# Patient Record
Sex: Female | Born: 1975
Health system: Southern US, Community
[De-identification: ages and names within clinical notes are randomized; demographics above are authoritative.]

## PROBLEM LIST (undated history)

## (undated) DIAGNOSIS — I10 Essential (primary) hypertension: Secondary | ICD-10-CM

## (undated) DIAGNOSIS — R7303 Prediabetes: Secondary | ICD-10-CM

## (undated) DIAGNOSIS — N3281 Overactive bladder: Secondary | ICD-10-CM

## (undated) DIAGNOSIS — Z973 Presence of spectacles and contact lenses: Secondary | ICD-10-CM

## (undated) HISTORY — PX: LIVER RESECTION: SHX1977

## (undated) HISTORY — PX: APPENDECTOMY: SHX54

## (undated) HISTORY — DX: Essential (primary) hypertension: I10

## (undated) HISTORY — PX: DILATION AND CURETTAGE OF UTERUS: SHX78

## (undated) HISTORY — DX: Overactive bladder: N32.81

---
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
## 2001-07-05 ENCOUNTER — Encounter: Admission: RE | Admit: 2001-07-05 | Discharge: 2001-07-05 | Payer: Self-pay | Admitting: Internal Medicine

## 2001-07-05 ENCOUNTER — Encounter: Payer: Self-pay | Admitting: Internal Medicine

## 2002-07-08 ENCOUNTER — Other Ambulatory Visit: Admission: RE | Admit: 2002-07-08 | Discharge: 2002-07-08 | Payer: Self-pay | Admitting: Obstetrics and Gynecology

## 2002-08-22 ENCOUNTER — Encounter: Payer: Self-pay | Admitting: Obstetrics and Gynecology

## 2002-08-22 ENCOUNTER — Ambulatory Visit (HOSPITAL_COMMUNITY): Admission: RE | Admit: 2002-08-22 | Discharge: 2002-08-22 | Payer: Self-pay | Admitting: Obstetrics and Gynecology

## 2002-12-25 ENCOUNTER — Encounter (HOSPITAL_COMMUNITY): Admission: RE | Admit: 2002-12-25 | Discharge: 2003-01-08 | Payer: Self-pay | Admitting: Obstetrics and Gynecology

## 2002-12-26 ENCOUNTER — Inpatient Hospital Stay (HOSPITAL_COMMUNITY): Admission: AD | Admit: 2002-12-26 | Discharge: 2002-12-26 | Payer: Self-pay | Admitting: Obstetrics and Gynecology

## 2003-01-12 ENCOUNTER — Inpatient Hospital Stay (HOSPITAL_COMMUNITY): Admission: AD | Admit: 2003-01-12 | Discharge: 2003-01-12 | Payer: Self-pay | Admitting: Obstetrics and Gynecology

## 2003-01-13 ENCOUNTER — Inpatient Hospital Stay (HOSPITAL_COMMUNITY): Admission: AD | Admit: 2003-01-13 | Discharge: 2003-01-15 | Payer: Self-pay | Admitting: Obstetrics and Gynecology

## 2003-08-11 ENCOUNTER — Other Ambulatory Visit: Admission: RE | Admit: 2003-08-11 | Discharge: 2003-08-11 | Payer: Self-pay | Admitting: Obstetrics and Gynecology

## 2003-12-08 ENCOUNTER — Encounter: Admission: RE | Admit: 2003-12-08 | Discharge: 2003-12-08 | Payer: Self-pay | Admitting: Internal Medicine

## 2004-08-17 ENCOUNTER — Other Ambulatory Visit: Admission: RE | Admit: 2004-08-17 | Discharge: 2004-08-17 | Payer: Self-pay | Admitting: Obstetrics and Gynecology

## 2005-09-19 ENCOUNTER — Other Ambulatory Visit: Admission: RE | Admit: 2005-09-19 | Discharge: 2005-09-19 | Payer: Self-pay | Admitting: Obstetrics and Gynecology

## 2006-10-02 ENCOUNTER — Ambulatory Visit (HOSPITAL_COMMUNITY): Admission: RE | Admit: 2006-10-02 | Discharge: 2006-10-02 | Payer: Self-pay | Admitting: Obstetrics and Gynecology

## 2006-11-20 ENCOUNTER — Ambulatory Visit (HOSPITAL_BASED_OUTPATIENT_CLINIC_OR_DEPARTMENT_OTHER): Admission: RE | Admit: 2006-11-20 | Discharge: 2006-11-20 | Payer: Self-pay | Admitting: Urology

## 2011-04-14 ENCOUNTER — Other Ambulatory Visit: Payer: Self-pay | Admitting: "Endocrinology

## 2011-04-15 LAB — CLIENT PROFILE 3332
Free T4: 1.27 ng/dL (ref 0.80–1.80)
T3, Free: 2.9 pg/mL (ref 2.3–4.2)
TSH: 1.416 u[IU]/mL (ref 0.350–4.500)

## 2011-04-15 LAB — THYROID PEROXIDASE ANTIBODY: Thyroperoxidase Ab SerPl-aCnc: 17.4 IU/mL (ref <35.0)

## 2012-06-05 ENCOUNTER — Ambulatory Visit: Payer: BC Managed Care – PPO | Admitting: Family Medicine

## 2012-06-05 VITALS — BP 124/80 | HR 93 | Temp 99.6°F | Resp 18 | Ht 63.0 in | Wt 186.0 lb

## 2012-06-05 DIAGNOSIS — J029 Acute pharyngitis, unspecified: Secondary | ICD-10-CM

## 2012-06-05 DIAGNOSIS — J020 Streptococcal pharyngitis: Secondary | ICD-10-CM

## 2012-06-05 LAB — POCT CBC
HCT, POC: 42.5 % (ref 37.7–47.9)
Hemoglobin: 13.2 g/dL (ref 12.2–16.2)
Lymph, poc: 1.2 (ref 0.6–3.4)
MCH, POC: 28.3 pg (ref 27–31.2)
MCHC: 31.1 g/dL — AB (ref 31.8–35.4)
MCV: 91.1 fL (ref 80–97)
POC MID %: 3.9 %M (ref 0–12)
RBC: 4.66 M/uL (ref 4.04–5.48)
WBC: 22.3 10*3/uL — AB (ref 4.6–10.2)

## 2012-06-05 LAB — POCT RAPID STREP A (OFFICE): Rapid Strep A Screen: POSITIVE — AB

## 2012-06-05 MED ORDER — IBUPROFEN 600 MG PO TABS: 600.0000 mg | ORAL_TABLET | Freq: Three times a day (TID) | ORAL | Status: AC | PRN

## 2012-06-05 MED ORDER — MAGIC MOUTHWASH W/LIDOCAINE: 10.0000 mL | ORAL | Status: DC | PRN

## 2012-06-05 MED ORDER — AZITHROMYCIN 250 MG PO TABS: ORAL_TABLET | ORAL | Status: AC

## 2012-06-05 MED ORDER — IBUPROFEN 200 MG PO TABS
800.0000 mg | ORAL_TABLET | Freq: Once | ORAL | Status: AC
  Administered 2012-06-05: 800 mg via ORAL

## 2012-06-05 NOTE — Patient Instructions (Addendum)
1. Sore throat  POCT rapid strep A, POCT CBC, POCT glucose (manual entry), ibuprofen (ADVIL,MOTRIN) tablet 800 mg  2. Strep pharyngitis  azithromycin (ZITHROMAX) 250 MG tablet, Alum & Mag Hydroxide-Simeth (MAGIC MOUTHWASH W/LIDOCAINE) SOLN   Strep Throat Strep throat is an infection of the throat caused by a bacteria named Streptococcus pyogenes. Your caregiver may call the infection streptococcal "tonsillitis" or "pharyngitis" depending on whether there are signs of inflammation in the tonsils or back of the throat. Strep throat is most common in children from 63 to 61 years old during the cold months of the year, but it can occur in people of any age during any season. This infection is spread from person to person (contagious) through coughing, sneezing, or other close contact. SYMPTOMS   Fever or chills.   Painful, swollen, red tonsils or throat.   Pain or difficulty when swallowing.   White or yellow spots on the tonsils or throat.   Swollen, tender lymph nodes or "glands" of the neck or under the jaw.   Red rash all over the body (rare).  DIAGNOSIS  Many different infections can cause the same symptoms. A test must be done to confirm the diagnosis so the right treatment can be given. A "rapid strep test" can help your caregiver make the diagnosis in a few minutes. If this test is not available, a light swab of the infected area can be used for a throat culture test. If a throat culture test is done, results are usually available in a day or two. TREATMENT  Strep throat is treated with antibiotic medicine. HOME CARE INSTRUCTIONS   Gargle with 1 tsp of salt in 1 cup of warm water, 3 to 4 times per day or as needed for comfort.   Family members who also have a sore throat or fever should be tested for strep throat and treated with antibiotics if they have the strep infection.   Make sure everyone in your household washes their hands well.   Do not share food, drinking cups, or  personal items that could cause the infection to spread to others.   You may need to eat a soft food diet until your sore throat gets better.   Drink enough water and fluids to keep your urine clear or pale yellow. This will help prevent dehydration.   Get plenty of rest.   Stay home from school, daycare, or work until you have been on antibiotics for 24 hours.   Only take over-the-counter or prescription medicines for pain, discomfort, or fever as directed by your caregiver.   If antibiotics are prescribed, take them as directed. Finish them even if you start to feel better.  SEEK MEDICAL CARE IF:   The glands in your neck continue to enlarge.   You develop a rash, cough, or earache.   You cough up green, yellow-brown, or bloody sputum.   You have pain or discomfort not controlled by medicines.   Your problems seem to be getting worse rather than better.  SEEK IMMEDIATE MEDICAL CARE IF:   You develop any new symptoms such as vomiting, severe headache, stiff or painful neck, chest pain, shortness of breath, or trouble swallowing.   You develop severe throat pain, drooling, or changes in your voice.   You develop swelling of the neck, or the skin on the neck becomes red and tender.   You have a fever.   You develop signs of dehydration, such as fatigue, dry mouth, and decreased urination.  You become increasingly sleepy, or you cannot wake up completely.  Document Released: 09/16/2000 Document Revised: 09/08/2011 Document Reviewed: 11/18/2010 [facility] Patient Information 2012 [place], [place].

## 2012-06-05 NOTE — Progress Notes (Signed)
Subjective:    Patient ID: Shannon Salas, female    DOB: 22-Dec-1975, 36 y.o.   MRN: [id number]  HPIThis 36 y.o. female presents for evaluation of sore throat, fever.  +fever Tmax 101.1.  +HA; no ear pain; +ST Left only; +pain with swallowing; onset yesterday.  +spitting.  +pain with talking; +pain with opening mouth.  No rhinorrhea; no nasal congestion; no cough.  Mild nausea no vomiting.  No diarrhea.  No rash.  No tick bites but does have a bite on RLE new yesterday.  Boyfriend with cold last week; no other sick exposures.    PCP: [facility] PMH: HTN, overactive bladder. Psurg: Appendectomy, C-section, D&C, cyst cervical resection. All: PCN (rash) Medications: OCP, Amlodipine, Metoprolol, Detrol Social: +tobacco sporadic over past two months.   Review of Systems  Constitutional: Positive for fever, chills, diaphoresis and fatigue.  HENT: Positive for sore throat. Negative for ear pain, congestion, rhinorrhea, mouth sores, neck stiffness and postnasal drip.   Respiratory: Negative for cough and shortness of breath.   Gastrointestinal: Positive for nausea. Negative for vomiting, abdominal pain and diarrhea.  Skin: Positive for wound. Negative for rash.  Neurological: Positive for headaches.    Past Medical History  Diagnosis Date  . Hypertension   . Overactive bladder     Past Surgical History  Procedure Date  . Appendectomy   . Cesarean section   . Dilation and curettage of uterus     Prior to Admission medications   Medication Sig Start Date End Date Taking? Authorizing Provider  amLODipine (NORVASC) 10 MG tablet Take 10 mg by mouth daily.   Yes Historical Provider, MD  metoprolol (LOPRESSOR) 50 MG tablet Take 25 mg by mouth 2 (two) times daily.   Yes Historical Provider, MD  tolterodine (DETROL LA) 4 MG 24 hr capsule Take 4 mg by mouth daily.   Yes Historical Provider, MD    Allergies  Allergen Reactions  . Penicillins Rash    History   Social  History  . Marital Status: Married    Spouse Name: N/A    Number of Children: N/A  . Years of Education: N/A   Occupational History  . Not on file.   Social History Main Topics  . Smoking status: Current Some Day Smoker  . Smokeless tobacco: Not on file  . Alcohol Use: 1.2 oz/week    2 Glasses of wine per week  . Drug Use: Not on file  . Sexually Active: Not on file   Other Topics Concern  . Not on file   Social History Narrative  . No narrative on file    No family history on file.     Objective:   Physical Exam  Nursing note and vitals reviewed. Constitutional: She is oriented to person, place, and time. She appears well-developed and well-nourished. She appears distressed.  HENT:  Head: Normocephalic and atraumatic.  Right Ear: External ear normal.  Left Ear: External ear normal.  Mouth/Throat: Oropharyngeal exudate present.  Eyes: Conjunctivae and EOM are normal. Pupils are equal, round, and reactive to light.  Neck: Normal range of motion. Neck supple.  Cardiovascular: Normal rate, regular rhythm, normal heart sounds and intact distal pulses.   No murmur heard. Pulmonary/Chest: Effort normal and breath sounds normal. No stridor.  Lymphadenopathy:    She has cervical adenopathy.  Neurological: She is alert and oriented to person, place, and time. No cranial nerve deficit.  Skin: Skin is warm and dry. No rash noted. She is  not diaphoretic.       R LATERAL LOWER LEG:  HEALING PUSTULE WITH 1 CM AREA SURROUNDING ERYTHEMA; NO INDURATION, FLUCTUANTS.  Psychiatric: She has a normal mood and affect. Her behavior is normal. Judgment and thought content normal.     Results for orders placed in visit on 06/05/12  POCT RAPID STREP A (OFFICE)      Component Value Range   Rapid Strep A Screen Positive (*) Negative  POCT CBC      Component Value Range   WBC 22.3 (*) 4.6 - 10.2 K/uL   Lymph, poc 1.2  0.6 - 3.4   POC LYMPH PERCENT 5.2 (*) 10 - 50 %L   MID (cbc) 0.9  0 -  0.9   POC MID % 3.9  0 - 12 %M   POC Granulocyte 20.3 (*) 2 - 6.9   Granulocyte percent 90.9 (*) 37 - 80 %G   RBC 4.66  4.04 - 5.48 M/uL   Hemoglobin 13.2  12.2 - 16.2 g/dL   HCT, POC 16.1  09.6 - 47.9 %   MCV 91.1  80 - 97 fL   MCH, POC 28.3  27 - 31.2 pg   MCHC 31.1 (*) 31.8 - 35.4 g/dL   RDW, POC 04.5     Platelet Count, POC 389  142 - 424 K/uL   MPV 8.3  0 - 99.8 fL  GLUCOSE, POCT (MANUAL RESULT ENTRY)      Component Value Range   POC Glucose 107 (*) 70 - 99 mg/dl        Assessment & Plan:   1. Sore throat  POCT rapid strep A, POCT CBC, POCT glucose (manual entry), ibuprofen (ADVIL,MOTRIN) tablet 800 mg  2. Strep pharyngitis  azithromycin (ZITHROMAX) 250 MG tablet, Alum & Mag Hydroxide-Simeth (MAGIC MOUTHWASH W/LIDOCAINE) SOLN    1.  Strep Pharyngitis:  New.  Treat with Zithromax high dose due to PCN allergy; rx for Magic Mouthwash also provided.  Rx Ibuprofen 800mg  tid scheduled PRN sore throat, fever.  RTC inability to swallow or acute worsening.

## 2012-07-13 NOTE — Progress Notes (Signed)
Reviewed and agree.

## 2014-03-05 ENCOUNTER — Ambulatory Visit
Admission: RE | Admit: 2014-03-05 | Discharge: 2014-03-05 | Disposition: A | Discharge status: Home / Self Care | Payer: 59 | Source: Ambulatory Visit | Attending: Internal Medicine | Admitting: Internal Medicine

## 2014-03-05 ENCOUNTER — Other Ambulatory Visit: Payer: Self-pay | Admitting: Internal Medicine

## 2014-03-05 DIAGNOSIS — M25569 Pain in unspecified knee: Secondary | ICD-10-CM

## 2015-12-16 ENCOUNTER — Encounter (INDEPENDENT_AMBULATORY_CARE_PROVIDER_SITE_OTHER): Payer: Self-pay

## 2016-10-03 DIAGNOSIS — D134 Benign neoplasm of liver: Secondary | ICD-10-CM

## 2016-10-03 HISTORY — PX: LIVER BIOPSY: SHX301

## 2016-10-03 HISTORY — DX: Benign neoplasm of liver: D13.4

## 2016-12-09 DIAGNOSIS — L02431 Carbuncle of right axilla: Secondary | ICD-10-CM | POA: Diagnosis not present

## 2016-12-26 DIAGNOSIS — L732 Hidradenitis suppurativa: Secondary | ICD-10-CM | POA: Diagnosis not present

## 2017-01-25 DIAGNOSIS — Z130 Encounter for screening for diseases of the blood and blood-forming organs and certain disorders involving the immune mechanism: Secondary | ICD-10-CM | POA: Diagnosis not present

## 2017-01-25 DIAGNOSIS — Z01419 Encounter for gynecological examination (general) (routine) without abnormal findings: Secondary | ICD-10-CM | POA: Diagnosis not present

## 2017-03-27 ENCOUNTER — Other Ambulatory Visit: Payer: Self-pay | Admitting: Internal Medicine

## 2017-03-27 DIAGNOSIS — L732 Hidradenitis suppurativa: Secondary | ICD-10-CM | POA: Diagnosis not present

## 2017-03-27 DIAGNOSIS — R7309 Other abnormal glucose: Secondary | ICD-10-CM | POA: Diagnosis not present

## 2017-03-27 DIAGNOSIS — Z0000 Encounter for general adult medical examination without abnormal findings: Secondary | ICD-10-CM | POA: Diagnosis not present

## 2017-03-27 DIAGNOSIS — Z1231 Encounter for screening mammogram for malignant neoplasm of breast: Secondary | ICD-10-CM

## 2017-03-28 ENCOUNTER — Other Ambulatory Visit: Payer: Self-pay | Admitting: Internal Medicine

## 2017-03-28 DIAGNOSIS — R748 Abnormal levels of other serum enzymes: Secondary | ICD-10-CM

## 2017-04-07 ENCOUNTER — Ambulatory Visit
Admission: RE | Admit: 2017-04-07 | Discharge: 2017-04-07 | Disposition: A | Discharge status: Home / Self Care | Payer: 59 | Source: Ambulatory Visit | Attending: Internal Medicine | Admitting: Internal Medicine

## 2017-04-07 DIAGNOSIS — R748 Abnormal levels of other serum enzymes: Secondary | ICD-10-CM

## 2017-04-10 ENCOUNTER — Other Ambulatory Visit: Payer: Self-pay | Admitting: Internal Medicine

## 2017-04-10 ENCOUNTER — Ambulatory Visit
Admission: RE | Admit: 2017-04-10 | Discharge: 2017-04-10 | Disposition: A | Discharge status: Home / Self Care | Payer: 59 | Source: Ambulatory Visit | Attending: Internal Medicine | Admitting: Internal Medicine

## 2017-04-10 DIAGNOSIS — Z1231 Encounter for screening mammogram for malignant neoplasm of breast: Secondary | ICD-10-CM | POA: Diagnosis not present

## 2017-04-10 DIAGNOSIS — K769 Liver disease, unspecified: Secondary | ICD-10-CM

## 2017-04-11 ENCOUNTER — Other Ambulatory Visit: Payer: Self-pay | Admitting: Internal Medicine

## 2017-04-11 DIAGNOSIS — R928 Other abnormal and inconclusive findings on diagnostic imaging of breast: Secondary | ICD-10-CM

## 2017-04-17 ENCOUNTER — Ambulatory Visit
Admission: RE | Admit: 2017-04-17 | Discharge: 2017-04-17 | Disposition: A | Discharge status: Home / Self Care | Payer: 59 | Source: Ambulatory Visit | Attending: Internal Medicine | Admitting: Internal Medicine

## 2017-04-17 DIAGNOSIS — R928 Other abnormal and inconclusive findings on diagnostic imaging of breast: Secondary | ICD-10-CM | POA: Diagnosis not present

## 2017-04-17 DIAGNOSIS — N6489 Other specified disorders of breast: Secondary | ICD-10-CM | POA: Diagnosis not present

## 2017-04-22 ENCOUNTER — Ambulatory Visit
Admission: RE | Admit: 2017-04-22 | Discharge: 2017-04-22 | Disposition: A | Discharge status: Home / Self Care | Payer: 59 | Source: Ambulatory Visit | Attending: Internal Medicine | Admitting: Internal Medicine

## 2017-04-22 DIAGNOSIS — K7689 Other specified diseases of liver: Secondary | ICD-10-CM | POA: Diagnosis not present

## 2017-04-22 DIAGNOSIS — K769 Liver disease, unspecified: Secondary | ICD-10-CM

## 2017-04-22 MED ORDER — GADOBENATE DIMEGLUMINE 529 MG/ML IV SOLN
20.0000 mL | Freq: Once | INTRAVENOUS | Status: AC | PRN
  Administered 2017-04-22: 18 mL via INTRAVENOUS

## 2017-04-27 ENCOUNTER — Other Ambulatory Visit (HOSPITAL_COMMUNITY): Payer: Self-pay | Admitting: Internal Medicine

## 2017-04-27 DIAGNOSIS — K769 Liver disease, unspecified: Secondary | ICD-10-CM

## 2017-05-03 ENCOUNTER — Other Ambulatory Visit: Payer: Self-pay | Admitting: Student

## 2017-05-04 ENCOUNTER — Ambulatory Visit (HOSPITAL_COMMUNITY)
Admission: RE | Admit: 2017-05-04 | Discharge: 2017-05-04 | Disposition: A | Discharge status: Home / Self Care | Payer: 59 | Source: Ambulatory Visit | Attending: Internal Medicine | Admitting: Internal Medicine

## 2017-05-04 DIAGNOSIS — F17200 Nicotine dependence, unspecified, uncomplicated: Secondary | ICD-10-CM | POA: Insufficient documentation

## 2017-05-04 DIAGNOSIS — Z79899 Other long term (current) drug therapy: Secondary | ICD-10-CM | POA: Insufficient documentation

## 2017-05-04 DIAGNOSIS — N3281 Overactive bladder: Secondary | ICD-10-CM | POA: Diagnosis not present

## 2017-05-04 DIAGNOSIS — Z880 Allergy status to penicillin: Secondary | ICD-10-CM | POA: Diagnosis not present

## 2017-05-04 DIAGNOSIS — I10 Essential (primary) hypertension: Secondary | ICD-10-CM | POA: Insufficient documentation

## 2017-05-04 DIAGNOSIS — K769 Liver disease, unspecified: Secondary | ICD-10-CM | POA: Diagnosis not present

## 2017-05-04 DIAGNOSIS — K7689 Other specified diseases of liver: Secondary | ICD-10-CM | POA: Diagnosis not present

## 2017-05-04 LAB — CBC
HCT: 37.8 % (ref 36.0–46.0)
HEMOGLOBIN: 12.5 g/dL (ref 12.0–15.0)
MCH: 27.4 pg (ref 26.0–34.0)
MCHC: 33.1 g/dL (ref 30.0–36.0)
MCV: 82.9 fL (ref 78.0–100.0)
Platelets: 347 10*3/uL (ref 150–400)
RBC: 4.56 MIL/uL (ref 3.87–5.11)
RDW: 14.9 % (ref 11.5–15.5)
WBC: 7 10*3/uL (ref 4.0–10.5)

## 2017-05-04 LAB — PROTIME-INR
INR: 1
PROTHROMBIN TIME: 13.2 s (ref 11.4–15.2)

## 2017-05-04 LAB — APTT: aPTT: 32 seconds (ref 24–36)

## 2017-05-04 MED ORDER — OXYCODONE HCL 5 MG PO TABS
ORAL_TABLET | ORAL | Status: AC
  Filled 2017-05-04: qty 1

## 2017-05-04 MED ORDER — MIDAZOLAM HCL 2 MG/2ML IJ SOLN
INTRAMUSCULAR | Status: AC
  Filled 2017-05-04: qty 2

## 2017-05-04 MED ORDER — SODIUM CHLORIDE 0.9 % IV SOLN: INTRAVENOUS | Status: DC

## 2017-05-04 MED ORDER — OXYCODONE HCL 5 MG PO TABS
5.0000 mg | ORAL_TABLET | ORAL | Status: DC | PRN
  Administered 2017-05-04: 5 mg via ORAL

## 2017-05-04 MED ORDER — FENTANYL CITRATE (PF) 100 MCG/2ML IJ SOLN
INTRAMUSCULAR | Status: AC | PRN
  Administered 2017-05-04: 100 ug via INTRAVENOUS

## 2017-05-04 MED ORDER — LIDOCAINE HCL (PF) 1 % IJ SOLN
INTRAMUSCULAR | Status: AC
  Filled 2017-05-04: qty 30

## 2017-05-04 MED ORDER — MIDAZOLAM HCL 2 MG/2ML IJ SOLN
INTRAMUSCULAR | Status: AC | PRN
  Administered 2017-05-04: 2 mg via INTRAVENOUS

## 2017-05-04 MED ORDER — FENTANYL CITRATE (PF) 100 MCG/2ML IJ SOLN
INTRAMUSCULAR | Status: AC
  Filled 2017-05-04: qty 2

## 2017-05-04 NOTE — Procedures (Signed)
US guided core biopsies of right hepatic lobe lesion.  3 cores obtained.  Minimal blood loss and no immediate complication.   

## 2017-05-04 NOTE — H&P (Addendum)
Chief Complaint: Patient was seen in consultation today for liver lesion  Referring Physician(s): Husain,Karrar  Supervising Physician: Marybelle Killings  Patient Status: [facility] [place] - In-pt  History of Present Illness: Shannon Salas is a 41 y.o. female with past medical history of hypertension and overactive bladder who was found to have elevated Alk Phos on recent routine lab work.   MRI Abdomen 04/22/17 showed: 1. Approximately 12 scattered masses in the liver, the largest measuring 8.6 cm in long axis, with arterial phase enhancement, persistent capsular enhancement and some central washout. Top differential diagnostic considerations include multifocal hepatocellular carcinoma and hypervascular liver metastatic disease ; hepatic adenomatosis is a possibility although classic findings of adenomas are not present. Tissue diagnosis recommended. 2. Several small splenic cystic lesions with some faint marginal enhancement, possibly centrally necrotic metastatic lesions, less likely benign postinflammatory lesions. There is also a similar slightly septated hepatic cyst posteriorly in the right hepatic lobe. 3. Left adrenal adenoma. 4. The small right mid kidney angiomyolipoma is surprisingly more conspicuous on ultrasound than on today's MRI.  IR consulted for liver lesion biopsy at the request of Dr. Lysle Rubens.   Patient presents for procedure today in her usual state of health.  She has been NPO and does not take blood thinners.   Past Medical History:  Diagnosis Date  . Hypertension   . Overactive bladder     Past Surgical History:  Procedure Laterality Date  . APPENDECTOMY    . CESAREAN SECTION    . DILATION AND CURETTAGE OF UTERUS      Allergies: Penicillins  Medications: Prior to Admission medications   Medication Sig Start Date End Date Taking? Authorizing Provider  amLODipine (NORVASC) 5 MG tablet Take 5 mg by mouth daily.    Yes [provider]  b complex  vitamins tablet Take 1 tablet by mouth daily.   Yes [provider]  Cranberry 400 MG CAPS Take 1 capsule by mouth daily.   Yes [provider]  Ginkgo Biloba 40 MG TABS Take 1 tablet by mouth daily.   Yes [provider]  hydrochlorothiazide (MICROZIDE) 12.5 MG capsule Take 12.5 mg by mouth daily.   Yes [provider]  Magnesium 250 MG TABS Take 1 tablet by mouth every evening.   Yes [provider]  metoprolol succinate (TOPROL-XL) 50 MG 24 hr tablet Take 50 mg by mouth daily. Take with or immediately following a meal.   Yes [provider]  norethindrone (MICRONOR,CAMILA,ERRIN) 0.35 MG tablet Take 1 tablet by mouth daily.   Yes [provider]  Omega-3 Fatty Acids (FISH OIL) 1000 MG CAPS Take 1 capsule by mouth 2 (two) times daily.   Yes [provider]  oxybutynin (DITROPAN) 5 MG tablet Take 5 mg by mouth daily.   Yes [provider]  phentermine 30 MG capsule Take 30 mg by mouth every morning.   Yes [provider]  Potassium 99 MG TABS Take 1 tablet by mouth daily.   Yes [provider]     No family history on file.  Social History   Social History  . Marital status: Married    Spouse name: N/A  . Number of children: N/A  . Years of education: N/A   Social History Main Topics  . Smoking status: Current Some Day Smoker  . Smokeless tobacco: Not on file  . Alcohol use 1.2 oz/week    2 Glasses of wine per week  . Drug use: Unknown  .  Sexual activity: Not on file   Other Topics Concern  . Not on file   Social History Narrative  . No narrative on file    Review of Systems  Constitutional: Negative for fatigue and fever.  Respiratory: Negative for cough and shortness of breath.   Cardiovascular: Negative for chest pain.  Gastrointestinal: Negative for abdominal pain.  Psychiatric/Behavioral: Negative for behavioral problems and confusion.    Vital Signs: BP (!) 159/112  (BP Location: Right Arm)   Pulse 82   Temp 97.9 F (36.6 C) (Oral)   Ht '5\' 3"'  (1.6 m)   Wt 198 lb (89.8 kg)   SpO2 97%   BMI 35.07 kg/m   Physical Exam  Constitutional: She is oriented to person, place, and time. She appears well-developed.  Cardiovascular: Normal rate, regular rhythm and normal heart sounds.   Pulmonary/Chest: Effort normal and breath sounds normal. No respiratory distress.  Neurological: She is alert and oriented to person, place, and time.  Skin: Skin is warm and dry.  Psychiatric: She has a normal mood and affect. Her behavior is normal. Judgment and thought content normal.  Nursing note and vitals reviewed.   Mallampati Score:  MD Evaluation Airway: WNL Heart: WNL Abdomen: WNL Chest/ Lungs: WNL ASA  Classification: 3 Mallampati/Airway Score: Two  Imaging: Mr Abdomen Wwo Contrast  Result Date: 04/22/2017 CLINICAL DATA:  Liver lesion on recent ultrasound. Elevated alkaline phosphatase. EXAM: MRI ABDOMEN WITHOUT AND WITH CONTRAST TECHNIQUE: Multiplanar multisequence MR imaging of the abdomen was performed both before and after the administration of intravenous contrast. CONTRAST:  76m MULTIHANCE GADOBENATE DIMEGLUMINE 529 MG/ML IV SOLN Creatinine was obtained on site at [facility] [address] Results: Creatinine 0.8 mg/dL. COMPARISON:  Multiple exams, including 04/07/2017 ultrasound, and CT scan dated 12/08/2003 FINDINGS: Lower chest: Unremarkable Hepatobiliary: There are approximately 12 scattered masses in the liver with precontrast T1 signal characteristics similar to that of the liver and precontrast T2 signal characteristics generally greater than the liver. The largest mass measures 8.6 by 5.8 cm on image 40/13 and is situated in the left hepatic lobe. The lesions demonstrate arterial phase enhancement and marginal capsule or pseudo capsule appearance. On later phase images this capsule or pseudo capsule continues to enhance ; on the 90  second images the lesions continue to enhance greater than the liver although with some internal heterogeneity of the larger lesions. At 3 minutes, the central portions of the larger lesions appears to be washing out to near hepatic signal intensity. I do not see obvious underlying morphologic indicators of cirrhosis. No significant dropout of signal on out of phase images to suggest intralesional fat content. No biliary dilatation.  Gallbladder unremarkable. Posteriorly in the right hepatic lobe in a subcapsular location there is a 1.9 by 1.2 cm slightly septated cystic lesion without appreciable enhancement. Pancreas:  Unremarkable Spleen: 0.9 cm fluid signal intensity lesion with faint marginal enhancement in the dome of the spleen. Similar 10 mm lesion along the inferior margin of the spleen. Adrenals/Urinary Tract: 3.3 by 1.8 cm left adrenal mass with signal dropout on out of phase images compatible with adrenal adenoma. This lesion measured 2.4 by 1.3 cm on 12/08/2003. In knee right mid kidney laterally there is previously a small hyperechoic focus suspicious for angiomyolipoma. Interestingly enough this lesion is poorly apparent on today's MRI but may be present on image 27/5. Stomach/Bowel: Unremarkable Vascular/Lymphatic: There some small right colic nodes which are not overtly pathologically enlarged. Small lymph nodes along  the root of the mesentery. Other:  No supplemental non-categorized findings. Musculoskeletal: Levoconvex lumbar scoliosis with rotary component. IMPRESSION: 1. Approximately 12 scattered masses in the liver, the largest measuring 8.6 cm in long axis, with arterial phase enhancement, persistent capsular enhancement and some central washout. Top differential diagnostic considerations include multifocal hepatocellular carcinoma and hypervascular liver metastatic disease ; hepatic adenomatosis is a possibility although classic findings of adenomas are not present. Tissue diagnosis  recommended. 2. Several small splenic cystic lesions with some faint marginal enhancement, possibly centrally necrotic metastatic lesions, less likely benign postinflammatory lesions. There is also a similar slightly septated hepatic cyst posteriorly in the right hepatic lobe. 3. Left adrenal adenoma. 4. The small right mid kidney angiomyolipoma is surprisingly more conspicuous on ultrasound than on today's MRI. Electronically Signed   By: Van Clines M.D.   On: 04/22/2017 10:21   US Abdomen Complete  Result Date: 04/07/2017 CLINICAL DATA:  Elevated alkaline phosphatase, history hypertension and smoking EXAM: ABDOMEN ULTRASOUND COMPLETE COMPARISON:  CT abdomen and pelvis 12/08/2003 FINDINGS: Gallbladder: Normally distended without stones or wall thickening. No pericholecystic fluid or sonographic Murphy sign. Common bile duct: Diameter: 4 mm diameter , normal Liver: Inhomogeneous parenchymal echogenicity. RIGHT lobe liver mass 3.9 x 2.8 x 3.8 cm anteriorly, slightly hypoechoic to parenchyma. Second mass lesion centrally in RIGHT lobe liver 4.6 x 4.3 x 4.6 cm, slightly hypoechoic to parenchyma. Third liver lesion lateral segment LEFT lobe, isoechoic, 8.5 x 5.8 x 6.6 cm. These were not identified on the prior CT exam from 2005. Hepatopetal portal venous flow. IVC: Normal appearance Pancreas: Normal appearance Spleen: Inadequately visualized due to high subcostal position Right Kidney: Length: 11.8 cm. Tiny hyperechoic nodule mid RIGHT kidney 5 x 4 x 6 mm question angiomyolipoma, less likely a nonshadowing calcification, not seen on prior CT. No hydronephrosis. Left Kidney: Length: 11.1 cm. Normal morphology without mass or hydronephrosis. Abdominal aorta: Normal caliber Other findings: No free fluid IMPRESSION: Inhomogeneous liver which could represent heterogeneous fatty infiltration or cirrhosis. Three hepatic mass lesions are identified measuring 8.5 cm, 3.9 cm, and 4.6 cm in greatest dimensions. These  are of uncertain etiology and could be due to hepatic metastases or due to multiple primary hepatic benign or malignant lesions; characterization by MR imaging with and without contrast recommended. Question tiny angiomyolipoma RIGHT kidney. These results will be called to the ordering clinician or representative by the Radiologist Assistant, and communication documented in the PACS or zVision Dashboard. Electronically Signed   By: Lavonia Dana M.D.   On: 04/07/2017 10:20   Mm Digital Screening Bilateral  Result Date: 04/10/2017 CLINICAL DATA:  Screening. Baseline. EXAM: DIGITAL SCREENING BILATERAL MAMMOGRAM WITH CAD COMPARISON:  None. ACR Breast Density Category b: There are scattered areas of fibroglandular density. FINDINGS: In the left breast, a possible mass warrants further evaluation. This possible mass is seen within the lower left breast, at posterior depth, on the MLO projection only. In the right breast, no findings suspicious for malignancy. Images were processed with CAD. IMPRESSION: Further evaluation is suggested for possible mass in the left breast. RECOMMENDATION: Diagnostic mammogram and possibly ultrasound of the left breast. (Code:[id number]-L-58M) The patient will be contacted regarding the findings, and additional imaging will be scheduled. BI-RADS CATEGORY  0: Incomplete. Need additional imaging evaluation and/or prior mammograms for comparison. Electronically Signed   By: Franki Cabot M.D.   On: 04/10/2017 11:02   US Breast Ltd Uni Left Inc Axilla  Result Date: 04/17/2017 CLINICAL DATA:  Screening recall  from baseline for a possible left breast mass. EXAM: 2D DIGITAL DIAGNOSTIC UNILATERAL LEFT MAMMOGRAM WITH CAD AND ADJUNCT TOMO LEFT BREAST ULTRASOUND COMPARISON:  Screening mammogram dated 04/10/2017. ACR Breast Density Category b: There are scattered areas of fibroglandular density. FINDINGS: There is a possible mass in the lower slightly inner left breast at the inframammary fold, which may  actually represent a tortuous blood vessel. Ultrasound will be performed for further evaluation. Mammographic images were processed with CAD. Physical exam of the inferior left breast near the inframammary fold demonstrates no discrete palpable masses. Ultrasound targeted to the inferior left breast demonstrates normal fibroglandular tissue. No masses or suspicious areas of shadowing are identified. There is a prominent blood vessel seen running from the 5 to 7 o'clock position in the inferior left breast likely accounting for the asymmetry identified mammographically. IMPRESSION: 1. The possible mass identified in the inferior left breast appears to correspond with a tortuous blood vessel. 2. No mammographic or targeted sonographic evidence of left breast malignancy. RECOMMENDATION: Screening mammogram in one year.(Code:SM-B-01Y) I have discussed the findings and recommendations with the patient. Results were also provided in writing at the conclusion of the visit. If applicable, a reminder letter will be sent to the patient regarding the next appointment. BI-RADS CATEGORY  1: Negative. Electronically Signed   By: Ammie Ferrier M.D.   On: 04/17/2017 09:33   Mm Diag Breast Tomo Uni Left  Result Date: 04/17/2017 CLINICAL DATA:  Screening recall from baseline for a possible left breast mass. EXAM: 2D DIGITAL DIAGNOSTIC UNILATERAL LEFT MAMMOGRAM WITH CAD AND ADJUNCT TOMO LEFT BREAST ULTRASOUND COMPARISON:  Screening mammogram dated 04/10/2017. ACR Breast Density Category b: There are scattered areas of fibroglandular density. FINDINGS: There is a possible mass in the lower slightly inner left breast at the inframammary fold, which may actually represent a tortuous blood vessel. Ultrasound will be performed for further evaluation. Mammographic images were processed with CAD. Physical exam of the inferior left breast near the inframammary fold demonstrates no discrete palpable masses. Ultrasound targeted to the  inferior left breast demonstrates normal fibroglandular tissue. No masses or suspicious areas of shadowing are identified. There is a prominent blood vessel seen running from the 5 to 7 o'clock position in the inferior left breast likely accounting for the asymmetry identified mammographically. IMPRESSION: 1. The possible mass identified in the inferior left breast appears to correspond with a tortuous blood vessel. 2. No mammographic or targeted sonographic evidence of left breast malignancy. RECOMMENDATION: Screening mammogram in one year.(Code:SM-B-01Y) I have discussed the findings and recommendations with the patient. Results were also provided in writing at the conclusion of the visit. If applicable, a reminder letter will be sent to the patient regarding the next appointment. BI-RADS CATEGORY  1: Negative. Electronically Signed   By: Ammie Ferrier M.D.   On: 04/17/2017 09:33    Labs:  CBC: No results for input(s): WBC, HGB, HCT, PLT in the last 8760 hours.  COAGS: No results for input(s): INR, APTT in the last 8760 hours.  BMP: No results for input(s): NA, K, CL, CO2, GLUCOSE, BUN, CALCIUM, CREATININE, GFRNONAA, GFRAA in the last 8760 hours.  Invalid input(s): CMP  LIVER FUNCTION TESTS: No results for input(s): BILITOT, AST, ALT, ALKPHOS, PROT, ALBUMIN in the last 8760 hours.  TUMOR MARKERS: No results for input(s): AFPTM, CEA, CA199, CHROMGRNA in the last 8760 hours.  Assessment and Plan: Patient with past medical history of hypertension is found to have abnormal blood work on a routine  physical.   Imaging shows multiple liver lesions.  IR consulted for liver lesion biopsy at the request of Dr. Lysle Rubens.  Patient presents for procedure today.  She has been NPO.  She does not take blood thinners.  Risks and benefits discussed with the patient including, but not limited to bleeding, infection, damage to adjacent structures or low yield requiring additional tests. All of the  patient's questions were answered, patient is agreeable to proceed. Consent signed and in chart.  Thank you for this interesting consult.  I greatly enjoyed meeting Shannon Salas and look forward to participating in their care.  A copy of this report was sent to the requesting provider on this date.  Electronically Signed: Docia Barrier, PA 05/04/2017, 11:31 AM   I spent a total of  30 Minutes   in face to face in clinical consultation, greater than 50% of which was counseling/coordinating care for liver lesion

## 2017-05-04 NOTE — Discharge Instructions (Signed)
Liver Biopsy, Care After °These instructions give you information on caring for yourself after your procedure. Your doctor may also give you more specific instructions. Call your doctor if you have any problems or questions after your procedure. °Follow these instructions at home: °· Rest at home for 1-2 days or as told by your doctor. °· Have someone stay with you for at least 24 hours. °· Do not do these things in the first 24 hours: °? Drive. °? Use machinery. °? Take care of other people. °? Sign legal documents. °? Take a bath or shower. °· There are many different ways to close and cover a cut (incision). For example, a cut can be closed with stitches, skin glue, or adhesive strips. Follow your doctor's instructions on: °? Taking care of your cut. °? Changing and removing your bandage (dressing). °? Removing whatever was used to close your cut. °· Do not drink alcohol in the first week. °· Do not lift more than 5 pounds or play contact sports for the first 2 weeks. °· Take medicines only as told by your doctor. For 1 week, do not take medicine that has aspirin in it or medicines like ibuprofen. °· Get your test results. °Contact a doctor if: °· A cut bleeds and leaves more than just a small spot of blood. °· A cut is red, puffs up (swells), or hurts more than before. °· Fluid or something else comes from a cut. °· A cut smells bad. °· You have a fever or chills. °Get help right away if: °· You have swelling, bloating, or pain in your belly (abdomen). °· You get dizzy or faint. °· You have a rash. °· You feel sick to your stomach (nauseous) or throw up (vomit). °· You have trouble breathing, feel short of breath, or feel faint. °· Your chest hurts. °· You have problems talking or seeing. °· You have trouble balancing or moving your arms or legs. °This information is not intended to replace advice given to you by your health care provider. Make sure you discuss any questions you have with your health care  provider. °Document Released: 06/28/2008 Document Revised: 02/25/2016 Document Reviewed: 11/15/2013 °Elsevier Interactive Patient Education © 2018 [employer] ° ° ° ° °Moderate Conscious Sedation, Adult, Care After °These instructions provide you with information about caring for yourself after your procedure. Your health care provider may also give you more specific instructions. Your treatment has been planned according to current medical practices, but problems sometimes occur. Call your health care provider if you have any problems or questions after your procedure. °What can I expect after the procedure? °After your procedure, it is common: °· To feel sleepy for several hours. °· To feel clumsy and have poor balance for several hours. °· To have poor judgment for several hours. °· To vomit if you eat too soon. ° °Follow these instructions at home: °For at least 24 hours after the procedure: ° °· Do not: °? Participate in activities where you could fall or become injured. °? Drive. °? Use heavy machinery. °? Drink alcohol. °? Take sleeping pills or medicines that cause drowsiness. °? Make important decisions or sign legal documents. °? Take care of children on your own. °· Rest. °Eating and drinking °· Follow the diet recommended by your health care provider. °· If you vomit: °? Drink water, juice, or soup when you can drink without vomiting. °? Make sure you have little or no nausea before eating solid foods. °General instructions °·   Have a responsible adult stay with you until you are awake and alert. °· Take over-the-counter and prescription medicines only as told by your health care provider. °· If you smoke, do not smoke without supervision. °· Keep all follow-up visits as told by your health care provider. This is important. °Contact a health care provider if: °· You keep feeling nauseous or you keep vomiting. °· You feel light-headed. °· You develop a rash. °· You have a fever. °Get help right away  if: °· You have trouble breathing. °This information is not intended to replace advice given to you by your health care provider. Make sure you discuss any questions you have with your health care provider. °Document Released: 07/10/2013 Document Revised: 02/22/2016 Document Reviewed: 01/09/2016 °Elsevier Interactive Patient Education © 2018 [employer] ° ° °

## 2017-05-10 DIAGNOSIS — R748 Abnormal levels of other serum enzymes: Secondary | ICD-10-CM | POA: Diagnosis not present

## 2017-05-10 DIAGNOSIS — K769 Liver disease, unspecified: Secondary | ICD-10-CM | POA: Diagnosis not present

## 2017-05-17 ENCOUNTER — Encounter (HOSPITAL_COMMUNITY): Payer: Self-pay

## 2017-05-17 ENCOUNTER — Other Ambulatory Visit (HOSPITAL_COMMUNITY): Payer: Self-pay | Admitting: Internal Medicine

## 2017-05-17 DIAGNOSIS — K769 Liver disease, unspecified: Secondary | ICD-10-CM

## 2017-05-24 ENCOUNTER — Telehealth: Payer: Self-pay | Admitting: Diagnostic Radiology

## 2017-05-24 NOTE — Telephone Encounter (Signed)
Patient's imaging and pathology was reviewed at GI tumor conference this morning.  Dr. Saralyn Pilar felt the pathology was characteristic for changes associated with long term hormonal treatment.  However, the imaging characteristics remain indeterminate and the consensus was to biopsy another liver lesion to confirm this diagnosis.  I discussed this information with patient on the phone and we agreed to proceed with another biopsy.  Patient would like to be more sedated for next biopsy because she "felt everything" last time.  Plan for US guided biopsy of a liver lesion on Monday (8/27).  Plan to discuss with patient's primary physician, Dr. Deforest Hoyles, about the information from conference this morning.

## 2017-05-26 ENCOUNTER — Other Ambulatory Visit: Payer: Self-pay | Admitting: Radiology

## 2017-05-26 ENCOUNTER — Other Ambulatory Visit: Payer: Self-pay | Admitting: Student

## 2017-05-29 ENCOUNTER — Ambulatory Visit (HOSPITAL_COMMUNITY)
Admission: RE | Admit: 2017-05-29 | Discharge: 2017-05-29 | Disposition: A | Discharge status: Home / Self Care | Payer: 59 | Source: Ambulatory Visit | Attending: Internal Medicine | Admitting: Internal Medicine

## 2017-05-29 ENCOUNTER — Encounter (HOSPITAL_COMMUNITY): Payer: Self-pay

## 2017-05-29 DIAGNOSIS — I10 Essential (primary) hypertension: Secondary | ICD-10-CM | POA: Diagnosis not present

## 2017-05-29 DIAGNOSIS — N3281 Overactive bladder: Secondary | ICD-10-CM | POA: Diagnosis not present

## 2017-05-29 DIAGNOSIS — Z79899 Other long term (current) drug therapy: Secondary | ICD-10-CM | POA: Insufficient documentation

## 2017-05-29 DIAGNOSIS — R160 Hepatomegaly, not elsewhere classified: Secondary | ICD-10-CM | POA: Insufficient documentation

## 2017-05-29 DIAGNOSIS — Z9889 Other specified postprocedural states: Secondary | ICD-10-CM | POA: Insufficient documentation

## 2017-05-29 DIAGNOSIS — R748 Abnormal levels of other serum enzymes: Secondary | ICD-10-CM | POA: Diagnosis not present

## 2017-05-29 DIAGNOSIS — Z880 Allergy status to penicillin: Secondary | ICD-10-CM | POA: Insufficient documentation

## 2017-05-29 DIAGNOSIS — F17200 Nicotine dependence, unspecified, uncomplicated: Secondary | ICD-10-CM | POA: Insufficient documentation

## 2017-05-29 DIAGNOSIS — K769 Liver disease, unspecified: Secondary | ICD-10-CM | POA: Diagnosis present

## 2017-05-29 DIAGNOSIS — K759 Inflammatory liver disease, unspecified: Secondary | ICD-10-CM | POA: Diagnosis not present

## 2017-05-29 LAB — CBC
HEMATOCRIT: 36.6 % (ref 36.0–46.0)
HEMOGLOBIN: 12.1 g/dL (ref 12.0–15.0)
MCH: 28 pg (ref 26.0–34.0)
MCHC: 33.1 g/dL (ref 30.0–36.0)
MCV: 84.7 fL (ref 78.0–100.0)
Platelets: 327 10*3/uL (ref 150–400)
RBC: 4.32 MIL/uL (ref 3.87–5.11)
RDW: 14.7 % (ref 11.5–15.5)
WBC: 6.6 10*3/uL (ref 4.0–10.5)

## 2017-05-29 LAB — APTT: APTT: 34 s (ref 24–36)

## 2017-05-29 LAB — PROTIME-INR
INR: 1.02
Prothrombin Time: 13.4 seconds (ref 11.4–15.2)

## 2017-05-29 MED ORDER — MIDAZOLAM HCL 2 MG/2ML IJ SOLN
INTRAMUSCULAR | Status: AC | PRN
  Administered 2017-05-29: 2 mg via INTRAVENOUS
  Administered 2017-05-29: 4 mg via INTRAVENOUS

## 2017-05-29 MED ORDER — GELATIN ABSORBABLE 12-7 MM EX MISC
CUTANEOUS | Status: AC
  Filled 2017-05-29: qty 1

## 2017-05-29 MED ORDER — HYDROCODONE-ACETAMINOPHEN 5-325 MG PO TABS
1.0000 | ORAL_TABLET | Freq: Once | ORAL | Status: AC
  Administered 2017-05-29: 1 via ORAL

## 2017-05-29 MED ORDER — HYDROCODONE-ACETAMINOPHEN 5-325 MG PO TABS
ORAL_TABLET | ORAL | Status: AC
  Filled 2017-05-29: qty 1

## 2017-05-29 MED ORDER — MIDAZOLAM HCL 2 MG/2ML IJ SOLN
INTRAMUSCULAR | Status: AC
  Filled 2017-05-29: qty 4

## 2017-05-29 MED ORDER — FENTANYL CITRATE (PF) 100 MCG/2ML IJ SOLN
INTRAMUSCULAR | Status: AC
  Filled 2017-05-29: qty 2

## 2017-05-29 MED ORDER — SODIUM CHLORIDE 0.9 % IV SOLN: INTRAVENOUS | Status: DC

## 2017-05-29 MED ORDER — LIDOCAINE HCL (PF) 1 % IJ SOLN
INTRAMUSCULAR | Status: AC
  Filled 2017-05-29: qty 30

## 2017-05-29 MED ORDER — FENTANYL CITRATE (PF) 100 MCG/2ML IJ SOLN
INTRAMUSCULAR | Status: AC | PRN
  Administered 2017-05-29: 100 ug via INTRAVENOUS

## 2017-05-29 NOTE — H&P (Signed)
Chief Complaint: Patient was seen in consultation today for left liver lesion biopsy at the request of Husain,Karrar  Referring Physician(s): [place]  Supervising Physician: Jacqulynn Cadet  Patient Status: [facility] - Out-pt  History of Present Illness: Shannon Salas is a 41 y.o. female   Right liver lesion biopsy was performed by Dr Anselm Pancoast 8/2 Liver, needle/core biopsy, Right Hepatic Lobe - LIVER PARENCHYMA WITH MILD PORTAL ACTIVITY (GRADE 1 OF 4), MILD PORTAL FIBROSIS (STAGE 1 OF 4) AND SINUSOIDAL DILATATION, SEE COMMENT. - NO SIGNIFICANT STEATOSIS. - 1+ IRON, LOCATED PREDOMINANTLY IN SINUSOIDAL KUPFFER CELLS.  Pt with high Alk Phos level MRI IMPRESSION: 1. Approximately 12 scattered masses in the liver, the largest measuring 8.6 cm in long axis, with arterial phase enhancement, persistent capsular enhancement and some central washout. Top differential diagnostic considerations include multifocal hepatocellular carcinoma and hypervascular liver metastatic disease ; hepatic adenomatosis is a possibility although classic findings of adenomas are not present. Tissue diagnosis recommended.  Request for left lobe liver lesion biopsy (pt requesting extra sedation for this biopsy; previous biopsy was painful)   Past Medical History:  Diagnosis Date  . Hypertension   . Overactive bladder     Past Surgical History:  Procedure Laterality Date  . APPENDECTOMY    . CESAREAN SECTION    . DILATION AND CURETTAGE OF UTERUS      Allergies: Penicillins  Medications: Prior to Admission medications   Medication Sig Start Date End Date Taking? Authorizing Provider  amLODipine (NORVASC) 5 MG tablet Take 5 mg by mouth daily.    Yes [provider]  b complex vitamins tablet Take 1 tablet by mouth daily.   Yes [provider]  Cranberry 400 MG CAPS Take 1 capsule by mouth daily.   Yes [provider]  Ginkgo Biloba 40 MG TABS Take 1 tablet  by mouth daily.   Yes [provider]  hydrochlorothiazide (MICROZIDE) 12.5 MG capsule Take 12.5 mg by mouth daily.   Yes [provider]  Magnesium 250 MG TABS Take 1 tablet by mouth every evening.   Yes [provider]  metoprolol succinate (TOPROL-XL) 50 MG 24 hr tablet Take 50 mg by mouth daily. Take with or immediately following a meal.   Yes [provider]  norethindrone (MICRONOR,CAMILA,ERRIN) 0.35 MG tablet Take 1 tablet by mouth daily.   Yes [provider]  Omega-3 Fatty Acids (FISH OIL) 1000 MG CAPS Take 1 capsule by mouth 2 (two) times daily.   Yes [provider]  oxybutynin (DITROPAN) 5 MG tablet Take 5 mg by mouth daily.   Yes [provider]  Potassium 99 MG TABS Take 1 tablet by mouth daily.   Yes [provider]  phentermine 30 MG capsule Take 30 mg by mouth every morning.    [provider]     History reviewed. No pertinent family history.  Social History   Social History  . Marital status: Married    Spouse name: N/A  . Number of children: N/A  . Years of education: N/A   Social History Main Topics  . Smoking status: Current Some Day Smoker  . Smokeless tobacco: None  . Alcohol use 1.2 oz/week    2 Glasses of wine per week  . Drug use: Unknown  . Sexual activity: Not Asked   Other Topics Concern  . None   Social History Narrative  . None    Review of Systems: A 12 point ROS discussed and  pertinent positives are indicated in the HPI above.  All other systems are negative.  Review of Systems  Constitutional: Negative for activity change, appetite change, fatigue and fever.  Respiratory: Negative for shortness of breath.   Gastrointestinal: Negative for abdominal pain.  Neurological: Negative for weakness.  Psychiatric/Behavioral: Negative for behavioral problems and confusion.    Vital Signs: BP (!) 156/107 (BP Location: Right Arm)   Pulse 62   Temp 97.6 F (36.4 C)  (Oral)   Ht _0  (1.6 m)   Wt 198 lb (89.8 kg)   SpO2 100%   BMI 35.07 kg/m   Physical Exam  Constitutional: She is oriented to person, place, and time.  Cardiovascular: Normal rate, regular rhythm and normal heart sounds.   Pulmonary/Chest: Effort normal and breath sounds normal.  Abdominal: Soft. Bowel sounds are normal. There is no tenderness.  Musculoskeletal: Normal range of motion.  Neurological: She is alert and oriented to person, place, and time.  Skin: Skin is warm and dry.  Psychiatric: She has a normal mood and affect. Her behavior is normal. Judgment and thought content normal.  Nursing note and vitals reviewed.   Mallampati Score:  MD Evaluation Airway: WNL Heart: WNL Abdomen: WNL Chest/ Lungs: WNL ASA  Classification: 2 Mallampati/Airway Score: One  Imaging: US Biopsy  Result Date: 05/04/2017 INDICATION: 41 year old with multiple hepatic lesions and abnormal liver enzymes. Tissue diagnosis is needed. EXAM: ULTRASOUND-GUIDED LIVER LESION BIOPSY MEDICATIONS: None. ANESTHESIA/SEDATION: Moderate (conscious) sedation was employed during this procedure. A total of Versed 2.0 mg and Fentanyl 100 mcg was administered intravenously. Moderate Sedation Time: 10 minutes. The patient's level of consciousness and vital signs were monitored continuously by radiology nursing throughout the procedure under my direct supervision. FLUOROSCOPY TIME:  None COMPLICATIONS: None immediate. PROCEDURE: Informed written consent was obtained from the patient after a thorough discussion of the procedural risks, benefits and alternatives. All questions were addressed. A timeout was performed prior to the initiation of the procedure. Liver was evaluated with ultrasound. A lesion in the right hepatic lobe was targeted for biopsy. Right side of the abdomen was prepped and draped in sterile fashion. Skin and soft tissues were anesthetized with 1% lidocaine. 17 gauge needle was directed into the right  hepatic lesion with real-time ultrasound guidance. Three core biopsies obtained with an 18 gauge core device. Specimens placed in formalin. 17 gauge needle was removed without complication. Bandage placed over the puncture site. FINDINGS: Hypoechoic lesions in the liver. A right hepatic lobe lesion was targeted and biopsied. No significant bleeding or hematoma following the core biopsies. IMPRESSION: Successful ultrasound-guided core biopsies of a right hepatic lesion. Electronically Signed   By: Markus Daft M.D.   On: 05/04/2017 16:30    Labs:  CBC:  Recent Labs  05/04/17 1120 05/29/17 1141  WBC 7.0 6.6  HGB 12.5 12.1  HCT 37.8 36.6  PLT 347 327    COAGS:  Recent Labs  05/04/17 1120  INR 1.00  APTT 32    BMP: No results for input(s): NA, K, CL, CO2, GLUCOSE, BUN, CALCIUM, CREATININE, GFRNONAA, GFRAA in the last 8760 hours.  Invalid input(s): CMP  LIVER FUNCTION TESTS: No results for input(s): BILITOT, AST, ALT, ALKPHOS, PROT, ALBUMIN in the last 8760 hours.  TUMOR MARKERS: No results for input(s): AFPTM, CEA, CA199, CHROMGRNA in the last 8760 hours.  Assessment and Plan:  Elevated Alkaline phosphatase Abnormal MRI Right lobe liver lesion bx 8/2: benign Scheduled now for left lobe liver lesion biopsy Risks  and benefits discussed with the patient including, but not limited to bleeding, infection, damage to adjacent structures or low yield requiring additional tests. All of the patient's questions were answered, patient is agreeable to proceed. Consent signed and in chart.  Thank you for this interesting consult.  I greatly enjoyed meeting SAMERA MACY and look forward to participating in their care.  A copy of this report was sent to the requesting provider on this date.  Electronically Signed: Lavonia Drafts, PA-C 05/29/2017, 12:30 PM   I spent a total of    25 Minutes in face to face in clinical consultation, greater than 50% of which was  counseling/coordinating care for left lobe liver lesion biopsy

## 2017-05-29 NOTE — Discharge Instructions (Signed)
Liver Biopsy, Care After °These instructions give you information on caring for yourself after your procedure. Your doctor may also give you more specific instructions. Call your doctor if you have any problems or questions after your procedure. °Follow these instructions at home: °· Rest at home for 1-2 days or as told by your doctor. °· Have someone stay with you for at least 24 hours. °· Do not do these things in the first 24 hours: °? Drive. °? Use machinery. °? Take care of other people. °? Sign legal documents. °? Take a bath or shower. °· There are many different ways to close and cover a cut (incision). For example, a cut can be closed with stitches, skin glue, or adhesive strips. Follow your doctor's instructions on: °? Taking care of your cut. °? Changing and removing your bandage (dressing). °? Removing whatever was used to close your cut. °· Do not drink alcohol in the first week. °· Do not lift more than 5 pounds or play contact sports for the first 2 weeks. °· Take medicines only as told by your doctor. For 1 week, do not take medicine that has aspirin in it or medicines like ibuprofen. °· Get your test results. °Contact a doctor if: °· A cut bleeds and leaves more than just a small spot of blood. °· A cut is red, puffs up (swells), or hurts more than before. °· Fluid or something else comes from a cut. °· A cut smells bad. °· You have a fever or chills. °Get help right away if: °· You have swelling, bloating, or pain in your belly (abdomen). °· You get dizzy or faint. °· You have a rash. °· You feel sick to your stomach (nauseous) or throw up (vomit). °· You have trouble breathing, feel short of breath, or feel faint. °· Your chest hurts. °· You have problems talking or seeing. °· You have trouble balancing or moving your arms or legs. °This information is not intended to replace advice given to you by your health care provider. Make sure you discuss any questions you have with your health care  provider. °Document Released: 06/28/2008 Document Revised: 02/25/2016 Document Reviewed: 11/15/2013 °Elsevier Interactive Patient Education © 2018 [employer] ° °

## 2017-05-29 NOTE — Procedures (Signed)
Interventional Radiology Procedure Note  Procedure: US guided bx of left hepatic mass.   Complications: None  Estimated Blood Loss: None  Recommendations:  - Bedrest x 3 hrs - DC home  Signed,  Criselda Peaches, MD

## 2017-06-08 DIAGNOSIS — K7689 Other specified diseases of liver: Secondary | ICD-10-CM | POA: Diagnosis not present

## 2017-06-08 DIAGNOSIS — R748 Abnormal levels of other serum enzymes: Secondary | ICD-10-CM | POA: Diagnosis not present

## 2017-08-17 DIAGNOSIS — K7689 Other specified diseases of liver: Secondary | ICD-10-CM | POA: Diagnosis not present

## 2017-08-17 DIAGNOSIS — Z7189 Other specified counseling: Secondary | ICD-10-CM | POA: Diagnosis not present

## 2017-08-17 DIAGNOSIS — I10 Essential (primary) hypertension: Secondary | ICD-10-CM | POA: Diagnosis not present

## 2017-08-17 DIAGNOSIS — Z23 Encounter for immunization: Secondary | ICD-10-CM | POA: Diagnosis not present

## 2017-09-05 ENCOUNTER — Other Ambulatory Visit: Payer: Self-pay | Admitting: Gastroenterology

## 2017-09-05 DIAGNOSIS — R748 Abnormal levels of other serum enzymes: Secondary | ICD-10-CM

## 2017-09-08 ENCOUNTER — Ambulatory Visit
Admission: RE | Admit: 2017-09-08 | Discharge: 2017-09-08 | Disposition: A | Discharge status: Home / Self Care | Payer: 59 | Source: Ambulatory Visit | Attending: Gastroenterology | Admitting: Gastroenterology

## 2017-09-08 DIAGNOSIS — R748 Abnormal levels of other serum enzymes: Secondary | ICD-10-CM

## 2017-09-08 DIAGNOSIS — R7989 Other specified abnormal findings of blood chemistry: Secondary | ICD-10-CM | POA: Diagnosis not present

## 2017-09-19 DIAGNOSIS — K7689 Other specified diseases of liver: Secondary | ICD-10-CM | POA: Diagnosis not present

## 2017-09-19 DIAGNOSIS — R748 Abnormal levels of other serum enzymes: Secondary | ICD-10-CM | POA: Diagnosis not present

## 2018-01-10 DIAGNOSIS — K7689 Other specified diseases of liver: Secondary | ICD-10-CM | POA: Diagnosis not present

## 2018-01-10 DIAGNOSIS — R748 Abnormal levels of other serum enzymes: Secondary | ICD-10-CM | POA: Diagnosis not present

## 2018-02-01 DIAGNOSIS — Z1389 Encounter for screening for other disorder: Secondary | ICD-10-CM | POA: Diagnosis not present

## 2018-02-01 DIAGNOSIS — Z130 Encounter for screening for diseases of the blood and blood-forming organs and certain disorders involving the immune mechanism: Secondary | ICD-10-CM | POA: Diagnosis not present

## 2018-02-01 DIAGNOSIS — Z01419 Encounter for gynecological examination (general) (routine) without abnormal findings: Secondary | ICD-10-CM | POA: Diagnosis not present

## 2018-02-05 DIAGNOSIS — R748 Abnormal levels of other serum enzymes: Secondary | ICD-10-CM | POA: Diagnosis not present

## 2018-02-05 DIAGNOSIS — R160 Hepatomegaly, not elsewhere classified: Secondary | ICD-10-CM | POA: Diagnosis not present

## 2018-02-08 DIAGNOSIS — R160 Hepatomegaly, not elsewhere classified: Secondary | ICD-10-CM | POA: Diagnosis not present

## 2018-02-08 DIAGNOSIS — R748 Abnormal levels of other serum enzymes: Secondary | ICD-10-CM | POA: Diagnosis not present

## 2018-02-08 DIAGNOSIS — D134 Benign neoplasm of liver: Secondary | ICD-10-CM | POA: Diagnosis not present

## 2018-02-15 ENCOUNTER — Other Ambulatory Visit: Payer: Self-pay | Admitting: Internal Medicine

## 2018-02-15 DIAGNOSIS — Z1231 Encounter for screening mammogram for malignant neoplasm of breast: Secondary | ICD-10-CM

## 2018-02-15 DIAGNOSIS — Z0000 Encounter for general adult medical examination without abnormal findings: Secondary | ICD-10-CM | POA: Diagnosis not present

## 2018-03-06 DIAGNOSIS — R160 Hepatomegaly, not elsewhere classified: Secondary | ICD-10-CM | POA: Diagnosis not present

## 2018-03-06 DIAGNOSIS — K769 Liver disease, unspecified: Secondary | ICD-10-CM | POA: Diagnosis not present

## 2018-03-19 DIAGNOSIS — I10 Essential (primary) hypertension: Secondary | ICD-10-CM | POA: Diagnosis not present

## 2018-03-20 DIAGNOSIS — R160 Hepatomegaly, not elsewhere classified: Secondary | ICD-10-CM | POA: Diagnosis not present

## 2018-03-20 DIAGNOSIS — R748 Abnormal levels of other serum enzymes: Secondary | ICD-10-CM | POA: Diagnosis not present

## 2018-04-03 DIAGNOSIS — R160 Hepatomegaly, not elsewhere classified: Secondary | ICD-10-CM | POA: Diagnosis not present

## 2018-04-03 DIAGNOSIS — I10 Essential (primary) hypertension: Secondary | ICD-10-CM | POA: Diagnosis not present

## 2018-04-12 ENCOUNTER — Encounter: Payer: Self-pay | Admitting: Radiology

## 2018-04-12 ENCOUNTER — Ambulatory Visit
Admission: RE | Admit: 2018-04-12 | Discharge: 2018-04-12 | Disposition: A | Discharge status: Home / Self Care | Payer: 59 | Source: Ambulatory Visit | Attending: Internal Medicine | Admitting: Internal Medicine

## 2018-04-12 DIAGNOSIS — Z1231 Encounter for screening mammogram for malignant neoplasm of breast: Secondary | ICD-10-CM

## 2018-05-02 DIAGNOSIS — R160 Hepatomegaly, not elsewhere classified: Secondary | ICD-10-CM | POA: Diagnosis not present

## 2018-05-02 DIAGNOSIS — E7409 Other glycogen storage disease: Secondary | ICD-10-CM | POA: Diagnosis not present

## 2018-05-02 DIAGNOSIS — K740 Hepatic fibrosis: Secondary | ICD-10-CM | POA: Diagnosis not present

## 2018-05-02 DIAGNOSIS — E669 Obesity, unspecified: Secondary | ICD-10-CM | POA: Diagnosis not present

## 2018-05-02 DIAGNOSIS — I10 Essential (primary) hypertension: Secondary | ICD-10-CM | POA: Diagnosis not present

## 2018-05-02 DIAGNOSIS — D134 Benign neoplasm of liver: Secondary | ICD-10-CM | POA: Diagnosis not present

## 2018-05-02 HISTORY — PX: LIVER RESECTION: SHX1977

## 2018-05-16 ENCOUNTER — Encounter (HOSPITAL_COMMUNITY): Payer: Self-pay | Admitting: Emergency Medicine

## 2018-05-16 ENCOUNTER — Emergency Department (HOSPITAL_COMMUNITY)
Admission: EM | Admit: 2018-05-16 | Discharge: 2018-05-16 | Disposition: A | Discharge status: Home / Self Care | Payer: 59 | Attending: Emergency Medicine | Admitting: Emergency Medicine

## 2018-05-16 DIAGNOSIS — Y92530 Ambulatory surgery center as the place of occurrence of the external cause: Secondary | ICD-10-CM | POA: Insufficient documentation

## 2018-05-16 DIAGNOSIS — Y848 Other medical procedures as the cause of abnormal reaction of the patient, or of later complication, without mention of misadventure at the time of the procedure: Secondary | ICD-10-CM | POA: Insufficient documentation

## 2018-05-16 DIAGNOSIS — I10 Essential (primary) hypertension: Secondary | ICD-10-CM | POA: Insufficient documentation

## 2018-05-16 DIAGNOSIS — Y999 Unspecified external cause status: Secondary | ICD-10-CM | POA: Insufficient documentation

## 2018-05-16 DIAGNOSIS — T8140XA Infection following a procedure, unspecified, initial encounter: Secondary | ICD-10-CM | POA: Diagnosis not present

## 2018-05-16 DIAGNOSIS — Y9389 Activity, other specified: Secondary | ICD-10-CM | POA: Diagnosis not present

## 2018-05-16 DIAGNOSIS — L089 Local infection of the skin and subcutaneous tissue, unspecified: Secondary | ICD-10-CM | POA: Diagnosis not present

## 2018-05-16 DIAGNOSIS — Z79899 Other long term (current) drug therapy: Secondary | ICD-10-CM | POA: Diagnosis not present

## 2018-05-16 DIAGNOSIS — T148XXA Other injury of unspecified body region, initial encounter: Secondary | ICD-10-CM | POA: Diagnosis not present

## 2018-05-16 DIAGNOSIS — F17200 Nicotine dependence, unspecified, uncomplicated: Secondary | ICD-10-CM | POA: Insufficient documentation

## 2018-05-16 LAB — COMPREHENSIVE METABOLIC PANEL
ALBUMIN: 3.2 g/dL — AB (ref 3.5–5.0)
ALT: 39 U/L (ref 0–44)
ANION GAP: 9 (ref 5–15)
AST: 19 U/L (ref 15–41)
Alkaline Phosphatase: 102 U/L (ref 38–126)
BILIRUBIN TOTAL: 0.4 mg/dL (ref 0.3–1.2)
BUN: 13 mg/dL (ref 6–20)
CO2: 28 mmol/L (ref 22–32)
Calcium: 9.1 mg/dL (ref 8.9–10.3)
Chloride: 101 mmol/L (ref 98–111)
Creatinine, Ser: 0.76 mg/dL (ref 0.44–1.00)
GFR calc Af Amer: >60 mL/min (ref >60)
GLUCOSE: 100 mg/dL — AB (ref 70–99)
POTASSIUM: 3.5 mmol/L (ref 3.5–5.1)
Sodium: 138 mmol/L (ref 135–145)
TOTAL PROTEIN: 6.7 g/dL (ref 6.5–8.1)

## 2018-05-16 LAB — CBC
HEMATOCRIT: 28.6 % — AB (ref 36.0–46.0)
HEMOGLOBIN: 8.8 g/dL — AB (ref 12.0–15.0)
MCH: 27.7 pg (ref 26.0–34.0)
MCHC: 30.8 g/dL (ref 30.0–36.0)
MCV: 89.9 fL (ref 78.0–100.0)
Platelets: 430 10*3/uL — ABNORMAL HIGH (ref 150–400)
RBC: 3.18 MIL/uL — ABNORMAL LOW (ref 3.87–5.11)
RDW: 14.4 % (ref 11.5–15.5)
WBC: 8.6 10*3/uL (ref 4.0–10.5)

## 2018-05-16 LAB — I-STAT BETA HCG BLOOD, ED (MC, WL, AP ONLY): I-stat hCG, quantitative: <5 m[IU]/mL (ref <5)

## 2018-05-16 LAB — LIPASE, BLOOD: Lipase: 49 U/L (ref 11–51)

## 2018-05-16 MED ORDER — SULFAMETHOXAZOLE-TRIMETHOPRIM 800-160 MG PO TABS: 1.0000 | ORAL_TABLET | Freq: Two times a day (BID) | ORAL | 0 refills | Status: AC

## 2018-05-16 MED ORDER — HYDROCODONE-ACETAMINOPHEN 5-325 MG PO TABS: 1.0000 | ORAL_TABLET | Freq: Four times a day (QID) | ORAL | 0 refills | Status: DC | PRN

## 2018-05-16 MED ORDER — CEPHALEXIN 500 MG PO CAPS: 500.0000 mg | ORAL_CAPSULE | Freq: Four times a day (QID) | ORAL | 0 refills | Status: DC

## 2018-05-16 MED ORDER — CEFTRIAXONE SODIUM 1 G IJ SOLR
1.0000 g | Freq: Once | INTRAMUSCULAR | Status: AC
  Administered 2018-05-16: 1 g via INTRAMUSCULAR
  Filled 2018-05-16: qty 10

## 2018-05-16 MED ORDER — SULFAMETHOXAZOLE-TRIMETHOPRIM 800-160 MG PO TABS
1.0000 | ORAL_TABLET | Freq: Once | ORAL | Status: AC
  Administered 2018-05-16: 1 via ORAL
  Filled 2018-05-16: qty 1

## 2018-05-16 MED ORDER — STERILE WATER FOR INJECTION IJ SOLN
INTRAMUSCULAR | Status: AC
  Administered 2018-05-16: 10 mL
  Filled 2018-05-16: qty 10

## 2018-05-16 NOTE — ED Provider Notes (Signed)
[facility] EMERGENCY DEPARTMENT Provider Note   CSN: [id number] Arrival date & time: 05/16/18  0046     History   Chief Complaint Chief Complaint  Patient presents with  . Post-op Problem    HPI Shannon Salas is a 42 y.o. female.  Patient is a 42 year old female with history of liver cysts.  She is one week status post laparoscopic liver resection performed at [facility]  She has multiple laparoscopy incisions to the abdomen.  The one on the lower aspect of her abdomen became more painful, then began draining a cloudy material.  She called the on-call surgeon who told her to be seen in the clinic in the next day, and go to the ER if her symptoms worsen.  She experience worsening draining and presents for evaluation of this.  She denies any fevers or chills.  She denies any bowel or bladder complaints.  She does report pain to the lower aspect of the abdomen at the incision site.  The history is provided by the patient.    Past Medical History:  Diagnosis Date  . Hypertension   . Overactive bladder     There are no active problems to display for this patient.   Past Surgical History:  Procedure Laterality Date  . APPENDECTOMY    . CESAREAN SECTION    . DILATION AND CURETTAGE OF UTERUS    . LIVER RESECTION       OB History   None      Home Medications    Prior to Admission medications   Medication Sig Start Date End Date Taking? Authorizing Provider  amLODipine (NORVASC) 5 MG tablet Take 5 mg by mouth daily.     [provider]  b complex vitamins tablet Take 1 tablet by mouth daily.    [provider]  Cranberry 400 MG CAPS Take 1 capsule by mouth daily.    [provider]  Ginkgo Biloba 40 MG TABS Take 1 tablet by mouth daily.    [provider]  hydrochlorothiazide (MICROZIDE) 12.5 MG capsule Take 12.5 mg by mouth daily.    [provider]  Magnesium 250 MG TABS Take 1 tablet by mouth every  evening.    [provider]  metoprolol succinate (TOPROL-XL) 50 MG 24 hr tablet Take 50 mg by mouth daily. Take with or immediately following a meal.    [provider]  norethindrone (MICRONOR,CAMILA,ERRIN) 0.35 MG tablet Take 1 tablet by mouth daily.    [provider]  Omega-3 Fatty Acids (FISH OIL) 1000 MG CAPS Take 1 capsule by mouth 2 (two) times daily.    [provider]  oxybutynin (DITROPAN) 5 MG tablet Take 5 mg by mouth daily.    [provider]  phentermine 30 MG capsule Take 30 mg by mouth every morning.    [provider]  Potassium 99 MG TABS Take 1 tablet by mouth daily.    [provider]    Family History No family history on file.  Social History Social History   Tobacco Use  . Smoking status: Current Some Day Smoker  . Smokeless tobacco: Never Used  Substance Use Topics  . Alcohol use: Yes    Alcohol/week: 2.0 standard drinks    Types: 2 Glasses of wine per week  . Drug use: Never     Allergies   Penicillins   Review of Systems Review of Systems  All other systems reviewed and are negative.  Physical Exam Updated Vital Signs BP (!) 154/104   Pulse 79   Temp 98.5 F (36.9 C) (Oral)   Resp 14   Ht 5\' 3"  (1.6 m)   Wt 90.7 kg   SpO2 100%   BMI 35.43 kg/m   Physical Exam  Constitutional: She is oriented to person, place, and time. She appears well-developed and well-nourished. No distress.  HENT:  Head: Normocephalic and atraumatic.  Neck: Normal range of motion. Neck supple.  Cardiovascular: Normal rate and regular rhythm. Exam reveals no gallop and no friction rub.  No murmur heard. Pulmonary/Chest: Effort normal and breath sounds normal. No respiratory distress. She has no wheezes.  Abdominal: Soft. Bowel sounds are normal. She exhibits no distension. There is no tenderness.  There are multiple laparoscopy incision sites to the abdomen.  All appear clean, dry, and intact with  the exception of the most inferior incision.  This is located in the fold of her lower abdomen and pubic region.  It is draining a cloudy, serous fluid.  There is no palpable fluctuance and no significant erythema to the surrounding skin.  Musculoskeletal: Normal range of motion.  Neurological: She is alert and oriented to person, place, and time.  Skin: Skin is warm and dry. She is not diaphoretic.  Nursing note and vitals reviewed.    ED Treatments / Results  Labs (all labs ordered are listed, but only abnormal results are displayed) Labs Reviewed  COMPREHENSIVE METABOLIC PANEL - Abnormal; Notable for the following components:      Result Value   Glucose, Bld 100 (*)    Albumin 3.2 (*)    All other components within normal limits  CBC - Abnormal; Notable for the following components:   RBC 3.18 (*)    Hemoglobin 8.8 (*)    HCT 28.6 (*)    Platelets 430 (*)    All other components within normal limits  LIPASE, BLOOD  URINALYSIS, ROUTINE W REFLEX MICROSCOPIC  I-STAT BETA HCG BLOOD, ED ([facility], [facility], [facility] ONLY)    EKG None  Radiology No results found.  Procedures Procedures (including critical care time)  Medications Ordered in ED Medications  cefTRIAXone (ROCEPHIN) injection 1 g (has no administration in time range)  sulfamethoxazole-trimethoprim (BACTRIM DS,SEPTRA DS) 800-160 MG per tablet 1 tablet (has no administration in time range)     Initial Impression / Assessment and Plan / ED Course  I have reviewed the triage vital signs and the nursing notes.  Pertinent labs & imaging results that were available during my care of the patient were reviewed by me and considered in my medical decision making (see chart for details).  Findings do raise the concern for possible wound infection.  She will be treated with Keflex and Bactrim after receiving Rocephin here in the ER.  She will be discharged with these antibiotics and follow-up with her surgeon this Friday as scheduled,  sooner if she worsens.  She will be given additional pain medication as her tramadol seems ineffective.  The [place] was reviewed prior to prescribing.  Final Clinical Impressions(s) / ED Diagnoses   Final diagnoses:  None    ED Discharge Orders    None       Veryl Speak, MD 05/16/18 [id number]

## 2018-05-16 NOTE — ED Triage Notes (Signed)
Pt presents after her incision site was "leaking" fluid this evening. Pt had laparoscopic liver resection at [facility] 7/31 for benign liver masses.

## 2018-05-16 NOTE — Discharge Instructions (Signed)
Keflex and Bactrim as prescribed.  Hydrocodone is prescribed as needed for pain.  Local wound care with dressing changes several times daily.  Follow-up with your surgeon on Friday as scheduled.

## 2018-05-18 DIAGNOSIS — T8141XD Infection following a procedure, superficial incisional surgical site, subsequent encounter: Secondary | ICD-10-CM | POA: Diagnosis not present

## 2018-05-18 DIAGNOSIS — Z9049 Acquired absence of other specified parts of digestive tract: Secondary | ICD-10-CM | POA: Diagnosis not present

## 2018-06-12 DIAGNOSIS — Z9049 Acquired absence of other specified parts of digestive tract: Secondary | ICD-10-CM | POA: Diagnosis not present

## 2018-06-12 DIAGNOSIS — T8141XA Infection following a procedure, superficial incisional surgical site, initial encounter: Secondary | ICD-10-CM | POA: Diagnosis not present

## 2018-06-18 DIAGNOSIS — H0015 Chalazion left lower eyelid: Secondary | ICD-10-CM | POA: Diagnosis not present

## 2018-06-18 DIAGNOSIS — H01022 Squamous blepharitis right lower eyelid: Secondary | ICD-10-CM | POA: Diagnosis not present

## 2018-06-18 DIAGNOSIS — H00016 Hordeolum externum left eye, unspecified eyelid: Secondary | ICD-10-CM | POA: Diagnosis not present

## 2018-06-18 DIAGNOSIS — H01021 Squamous blepharitis right upper eyelid: Secondary | ICD-10-CM | POA: Diagnosis not present

## 2018-07-06 DIAGNOSIS — Z23 Encounter for immunization: Secondary | ICD-10-CM | POA: Diagnosis not present

## 2018-07-09 DIAGNOSIS — H01021 Squamous blepharitis right upper eyelid: Secondary | ICD-10-CM | POA: Diagnosis not present

## 2018-07-09 DIAGNOSIS — H01022 Squamous blepharitis right lower eyelid: Secondary | ICD-10-CM | POA: Diagnosis not present

## 2018-07-09 DIAGNOSIS — H0015 Chalazion left lower eyelid: Secondary | ICD-10-CM | POA: Diagnosis not present

## 2018-08-21 DIAGNOSIS — K7689 Other specified diseases of liver: Secondary | ICD-10-CM | POA: Diagnosis not present

## 2018-08-21 DIAGNOSIS — Z111 Encounter for screening for respiratory tuberculosis: Secondary | ICD-10-CM | POA: Diagnosis not present

## 2018-08-21 DIAGNOSIS — E782 Mixed hyperlipidemia: Secondary | ICD-10-CM | POA: Diagnosis not present

## 2018-08-21 DIAGNOSIS — E876 Hypokalemia: Secondary | ICD-10-CM | POA: Diagnosis not present

## 2018-08-21 DIAGNOSIS — I10 Essential (primary) hypertension: Secondary | ICD-10-CM | POA: Diagnosis not present

## 2018-08-29 DIAGNOSIS — D134 Benign neoplasm of liver: Secondary | ICD-10-CM | POA: Diagnosis not present

## 2018-09-17 DIAGNOSIS — E876 Hypokalemia: Secondary | ICD-10-CM | POA: Diagnosis not present

## 2018-10-01 DIAGNOSIS — H1032 Unspecified acute conjunctivitis, left eye: Secondary | ICD-10-CM | POA: Diagnosis not present

## 2018-10-01 DIAGNOSIS — J40 Bronchitis, not specified as acute or chronic: Secondary | ICD-10-CM | POA: Diagnosis not present

## 2018-10-03 HISTORY — PX: BREAST BIOPSY: SHX20

## 2018-12-25 DIAGNOSIS — E876 Hypokalemia: Secondary | ICD-10-CM | POA: Diagnosis not present

## 2019-02-20 DIAGNOSIS — Z0000 Encounter for general adult medical examination without abnormal findings: Secondary | ICD-10-CM | POA: Diagnosis not present

## 2019-02-20 DIAGNOSIS — I10 Essential (primary) hypertension: Secondary | ICD-10-CM | POA: Diagnosis not present

## 2019-02-20 DIAGNOSIS — E782 Mixed hyperlipidemia: Secondary | ICD-10-CM | POA: Diagnosis not present

## 2019-02-22 DIAGNOSIS — K7689 Other specified diseases of liver: Secondary | ICD-10-CM | POA: Diagnosis not present

## 2019-03-13 ENCOUNTER — Other Ambulatory Visit: Payer: Self-pay | Admitting: Obstetrics and Gynecology

## 2019-03-13 DIAGNOSIS — N6489 Other specified disorders of breast: Secondary | ICD-10-CM

## 2019-03-13 DIAGNOSIS — N6320 Unspecified lump in the left breast, unspecified quadrant: Secondary | ICD-10-CM

## 2019-03-25 ENCOUNTER — Ambulatory Visit
Admission: RE | Admit: 2019-03-25 | Discharge: 2019-03-25 | Disposition: A | Discharge status: Home / Self Care | Payer: 59 | Source: Ambulatory Visit | Attending: Obstetrics and Gynecology | Admitting: Obstetrics and Gynecology

## 2019-03-25 ENCOUNTER — Ambulatory Visit: Payer: 59

## 2019-03-25 ENCOUNTER — Other Ambulatory Visit: Payer: Self-pay | Admitting: Obstetrics and Gynecology

## 2019-03-25 DIAGNOSIS — N6489 Other specified disorders of breast: Secondary | ICD-10-CM

## 2019-03-25 DIAGNOSIS — N6320 Unspecified lump in the left breast, unspecified quadrant: Secondary | ICD-10-CM

## 2019-03-29 ENCOUNTER — Ambulatory Visit
Admission: RE | Admit: 2019-03-29 | Discharge: 2019-03-29 | Disposition: A | Discharge status: Home / Self Care | Payer: 59 | Source: Ambulatory Visit | Attending: Obstetrics and Gynecology | Admitting: Obstetrics and Gynecology

## 2019-03-29 DIAGNOSIS — N6320 Unspecified lump in the left breast, unspecified quadrant: Secondary | ICD-10-CM

## 2019-06-06 IMAGING — MR MR ABDOMEN WO/W CM
16 series · 48 of 48 positions shown · IV contrast (Multihance 18ml)
Comparison: Multiple exams, including 04/07/2017 ultrasound, and CT
scan dated 12/08/2003

CLINICAL DATA: Liver lesion on recent ultrasound. Elevated alkaline
phosphatase.

EXAM:
MRI ABDOMEN WITHOUT AND WITH CONTRAST
TECHNIQUE: Multiplanar multisequence MR imaging of the abdomen was performed
both before and after the administration of intravenous contrast.
CONTRAST:  18mL MULTIHANCE GADOBENATE DIMEGLUMINE 529 MG/ML IV SOLN
Creatinine was obtained on site at [HOSPITAL] at [HOSPITAL].
Results: Creatinine 0.8 mg/dL.

[Series 3: T2 · coronal · 5.0mm · 1.56mm/px · 2 of 36 slices shown (1 of 3)]
[im 1/36]
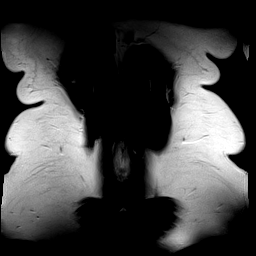
[im 36/36]
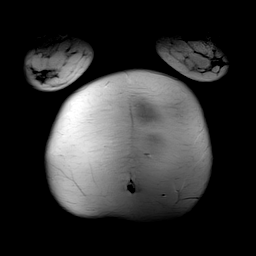

[Series 4: T1 · axial · 3.0mm · 1.19mm/px · z∈[-25,+188]mm · 6 of 144 slices shown]
[im 1/144]
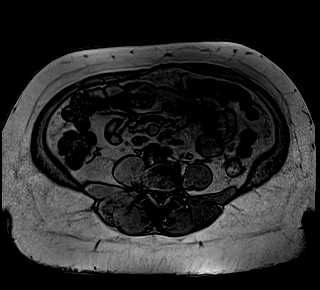
[im 29/144]
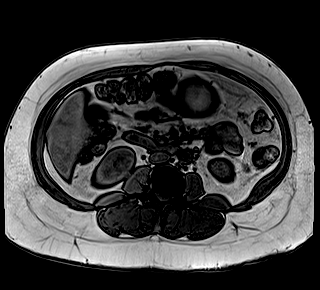
[im 58/144]
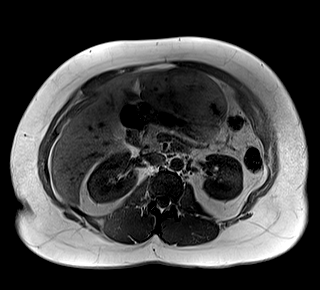
[im 86/144]
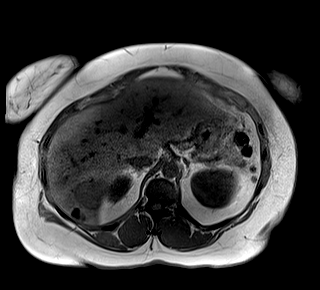
[im 115/144]
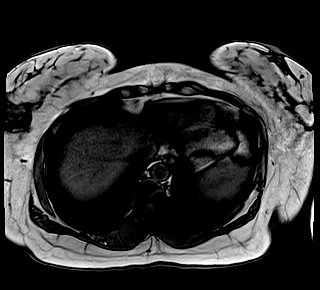
[im 144/144]
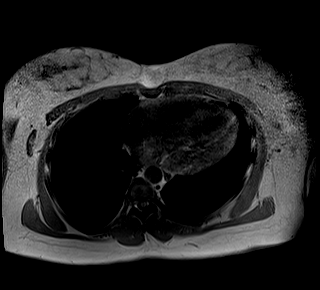

[Series 5: T2 · axial · 5.0mm · 1.48mm/px · z∈[+2,+224]mm · 2 of 38 slices shown (2 of 3)]
[im 1/38]
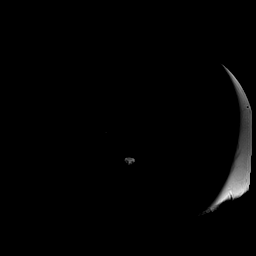
[im 38/38]
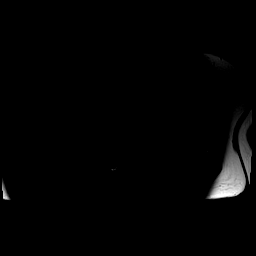

[Series 6: DWI · axial · 5.0mm · 1.42mm/px · z∈[+7,+217]mm · 5 of 108 slices shown (1 of 2)]
[im 1/108]
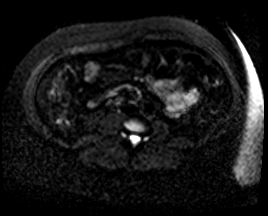
[im 27/108]
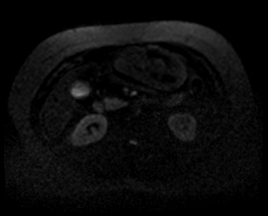
[im 54/108]
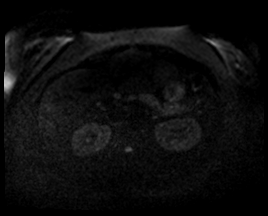
[im 81/108]
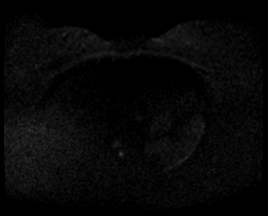
[im 108/108]
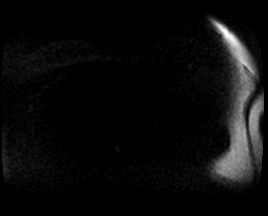

[Series 7: DWI · axial · 5.0mm · 1.42mm/px · z∈[+7,+217]mm · 2 of 36 slices shown (2 of 2)]
[im 1/36]
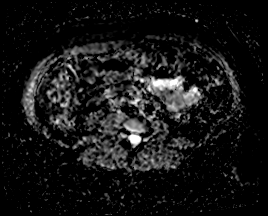
[im 36/36]
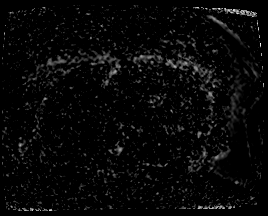

[Series 8: T2 · axial · 6.0mm · 1.19mm/px · 1 of 32 slices shown (3 of 3)]
[im 1/32]
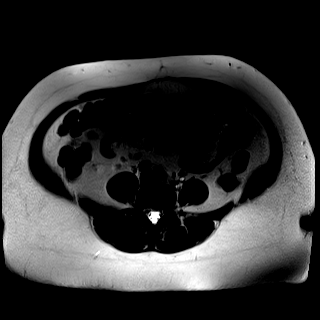

[Series 10: T1 dynamic · axial · non-contrast · 3.0mm · 1.25mm/px · z∈[-41,+172]mm · 3 of 72 slices shown]
[im 1/72]
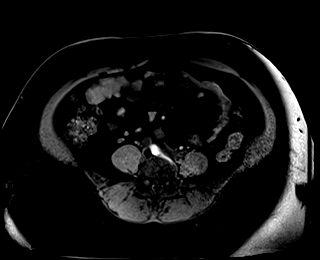
[im 36/72]
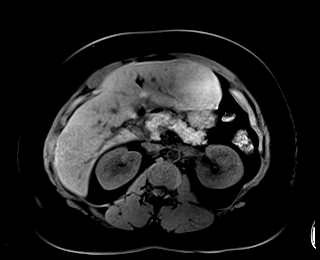
[im 72/72]
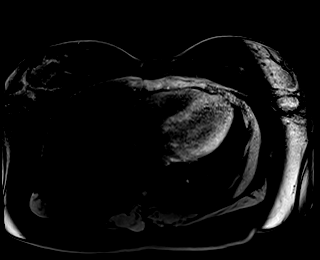

[Series 11: T1 dynamic post-contrast · axial · 3.0mm · 1.25mm/px · z∈[-41,+172]mm · 3 of 72 slices shown (1 of 9)]
[im 1/72]
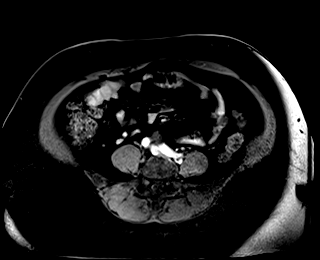
[im 36/72]
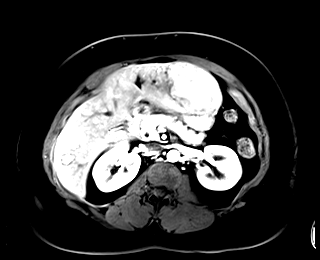
[im 72/72]
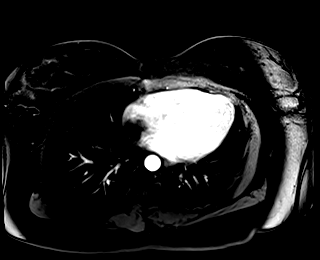

[Series 12: T1 dynamic post-contrast · axial · 3.0mm · 1.25mm/px · z∈[-41,+172]mm · 3 of 72 slices shown (2 of 9)]
[im 1/72]
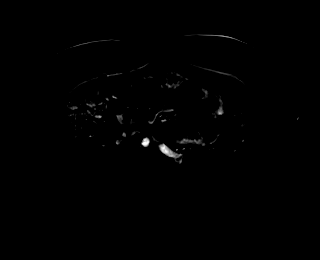
[im 36/72]
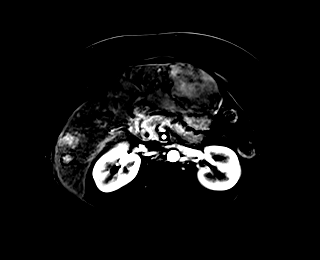
[im 72/72]
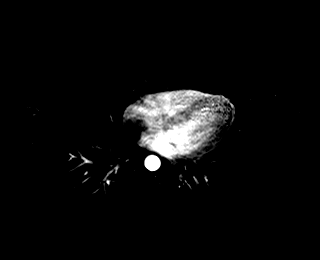

[Series 13: T1 dynamic post-contrast · axial · 3.0mm · 1.25mm/px · z∈[-41,+172]mm · 3 of 72 slices shown (3 of 9)]
[im 1/72]
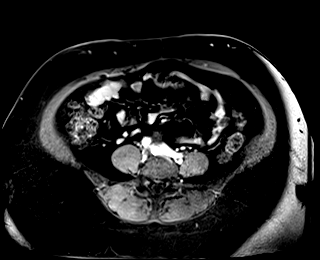
[im 36/72]
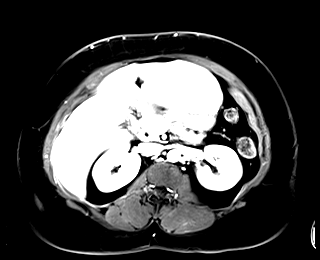
[im 72/72]
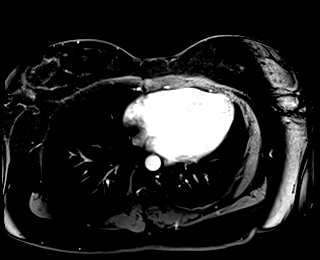

[Series 14: T1 dynamic post-contrast · axial · 3.0mm · 1.25mm/px · z∈[-41,+172]mm · 3 of 72 slices shown (4 of 9)]
[im 1/72]
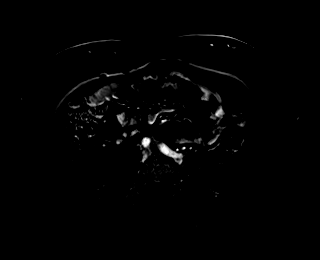
[im 36/72]
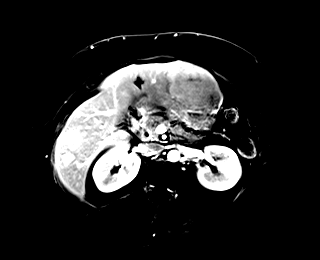
[im 72/72]
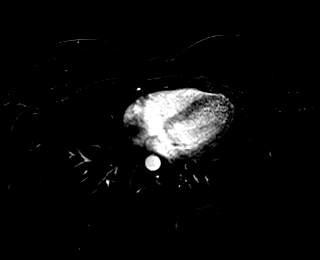

[Series 15: T1 dynamic post-contrast · axial · 3.0mm · 1.25mm/px · z∈[-41,+172]mm · 3 of 72 slices shown (5 of 9)]
[im 1/72]
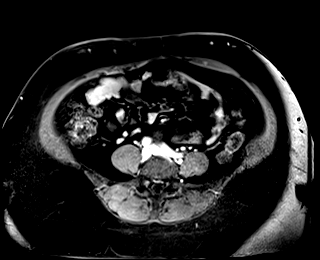
[im 36/72]
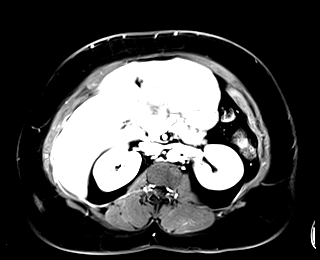
[im 72/72]
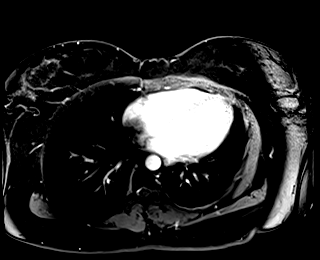

[Series 16: T1 dynamic post-contrast · axial · 3.0mm · 1.25mm/px · z∈[-41,+172]mm · 3 of 72 slices shown (6 of 9)]
[im 1/72]
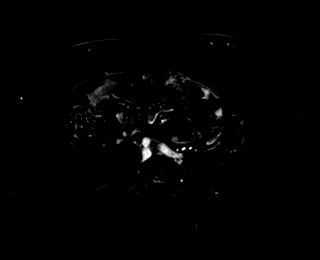
[im 36/72]
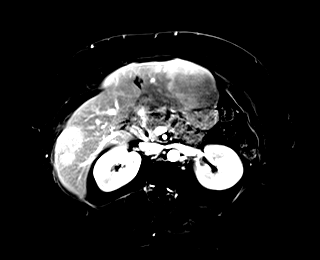
[im 72/72]
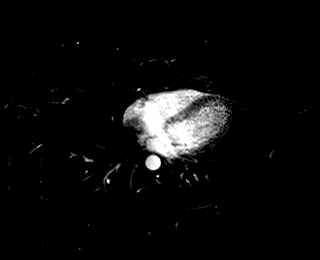

[Series 17: T1 dynamic post-contrast · coronal · 3.0mm · 1.25mm/px · 3 of 72 slices shown (7 of 9)]
[im 1/72]
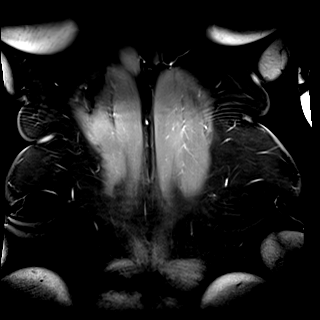
[im 36/72]
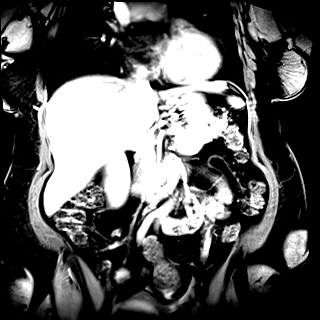
[im 72/72]
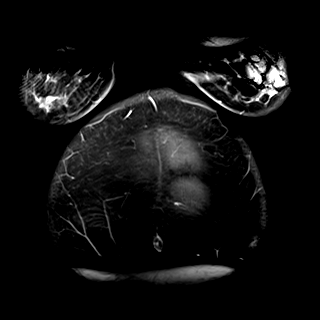

[Series 18: T1 dynamic post-contrast · axial · 3.0mm · 1.25mm/px · z∈[-41,+172]mm · 3 of 72 slices shown (8 of 9)]
[im 1/72]
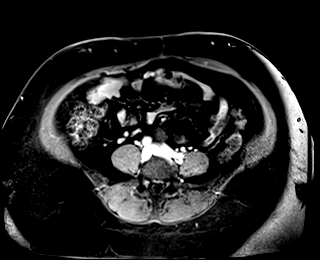
[im 36/72]
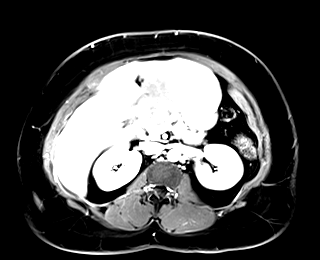
[im 72/72]
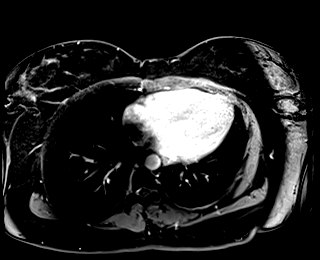

[Series 19: T1 dynamic post-contrast · axial · 3.0mm · 1.25mm/px · z∈[-41,+172]mm · 3 of 72 slices shown (9 of 9)]
[im 1/72]
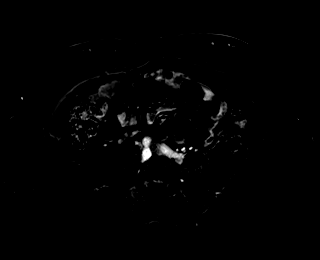
[im 36/72]
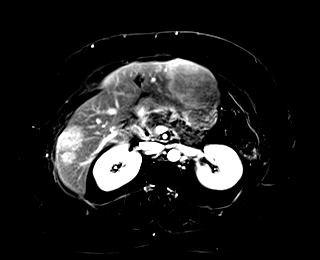
[im 72/72]
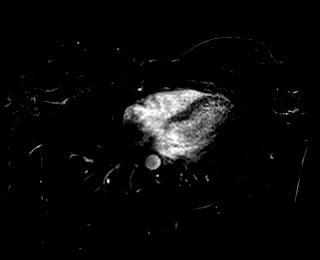

[48 of 48 positions shown; findings below may reference images not displayed]

FINDINGS: Lower chest: Unremarkable

Hepatobiliary: There are approximately 12 scattered masses in the
liver with precontrast T1 signal characteristics similar to that of
the liver and precontrast T2 signal characteristics generally
greater than the liver. The largest mass measures 8.6 by 5.8 cm on
image 40/13 and is situated in the left hepatic lobe. The lesions
demonstrate arterial phase enhancement and marginal capsule or
pseudo capsule appearance. On later phase images this capsule or
pseudo capsule continues to enhance ; on the 90 second images the
lesions continue to enhance greater than the liver although with
some internal heterogeneity of the larger lesions. At 3 minutes, the
central portions of the larger lesions appears to be washing out to
near hepatic signal intensity. I do not see obvious underlying
morphologic indicators of cirrhosis. No significant dropout of
signal on out of phase images to suggest intralesional fat content.

No biliary dilatation.  Gallbladder unremarkable.

Posteriorly in the right hepatic lobe in a subcapsular location
there is a 1.9 by 1.2 cm slightly septated cystic lesion without
appreciable enhancement.

Pancreas:  Unremarkable

Spleen: 0.9 cm fluid signal intensity lesion with faint marginal
enhancement in the dome of the spleen. Similar 10 mm lesion along
the inferior margin of the spleen.

Adrenals/Urinary Tract: 3.3 by 1.8 cm left adrenal mass with signal
dropout on out of phase images compatible with adrenal adenoma. This
lesion measured 2.4 by 1.3 cm on 12/08/2003.

In knee right mid kidney laterally there is previously a small
hyperechoic focus suspicious for angiomyolipoma. Interestingly
enough this lesion is poorly apparent on today's MRI but may be
present on image [DATE].

Stomach/Bowel: Unremarkable

Vascular/Lymphatic: There some small right colic nodes which are not
overtly pathologically enlarged. Small lymph nodes along the root of
the mesentery.

Other:  No supplemental non-categorized findings.

Musculoskeletal: Levoconvex lumbar scoliosis with rotary component.
IMPRESSION: 1. Approximately 12 scattered masses in the liver, the largest
measuring 8.6 cm in long axis, with arterial phase enhancement,
persistent capsular enhancement and some central washout. Top
differential diagnostic considerations include multifocal
hepatocellular carcinoma and hypervascular liver metastatic disease
; hepatic adenomatosis is a possibility although classic findings of
adenomas are not present. Tissue diagnosis recommended.
2. Several small splenic cystic lesions with some faint marginal
enhancement, possibly centrally necrotic metastatic lesions, less
likely benign postinflammatory lesions. There is also a similar
slightly septated hepatic cyst posteriorly in the right hepatic
lobe.
3. Left adrenal adenoma.
4. The small right mid kidney angiomyolipoma is surprisingly more
conspicuous on ultrasound than on today's MRI.

## 2019-08-12 ENCOUNTER — Other Ambulatory Visit: Payer: Self-pay | Admitting: Obstetrics and Gynecology

## 2019-08-12 DIAGNOSIS — N63 Unspecified lump in unspecified breast: Secondary | ICD-10-CM

## 2019-10-01 ENCOUNTER — Other Ambulatory Visit: Payer: Self-pay | Admitting: Obstetrics and Gynecology

## 2019-10-01 ENCOUNTER — Ambulatory Visit
Admission: RE | Admit: 2019-10-01 | Discharge: 2019-10-01 | Disposition: A | Discharge status: Home / Self Care | Payer: 59 | Source: Ambulatory Visit | Attending: Obstetrics and Gynecology | Admitting: Obstetrics and Gynecology

## 2019-10-01 ENCOUNTER — Other Ambulatory Visit: Payer: Self-pay

## 2019-10-01 DIAGNOSIS — N6320 Unspecified lump in the left breast, unspecified quadrant: Secondary | ICD-10-CM

## 2019-10-01 DIAGNOSIS — N63 Unspecified lump in unspecified breast: Secondary | ICD-10-CM

## 2020-04-02 ENCOUNTER — Ambulatory Visit
Admission: RE | Admit: 2020-04-02 | Discharge: 2020-04-02 | Disposition: A | Discharge status: Home / Self Care | Payer: 59 | Source: Ambulatory Visit | Attending: Obstetrics and Gynecology | Admitting: Obstetrics and Gynecology

## 2020-04-02 ENCOUNTER — Other Ambulatory Visit: Payer: Self-pay

## 2020-04-02 DIAGNOSIS — N6320 Unspecified lump in the left breast, unspecified quadrant: Secondary | ICD-10-CM

## 2021-03-30 ENCOUNTER — Other Ambulatory Visit: Payer: Self-pay | Admitting: Obstetrics and Gynecology

## 2021-03-30 DIAGNOSIS — Z1231 Encounter for screening mammogram for malignant neoplasm of breast: Secondary | ICD-10-CM

## 2021-04-01 ENCOUNTER — Other Ambulatory Visit: Payer: Self-pay | Admitting: Obstetrics and Gynecology

## 2021-04-01 DIAGNOSIS — Z30431 Encounter for routine checking of intrauterine contraceptive device: Secondary | ICD-10-CM

## 2021-04-01 DIAGNOSIS — N63 Unspecified lump in unspecified breast: Secondary | ICD-10-CM

## 2021-04-15 ENCOUNTER — Ambulatory Visit
Admission: RE | Admit: 2021-04-15 | Discharge: 2021-04-15 | Disposition: A | Discharge status: Home / Self Care | Payer: 59 | Source: Ambulatory Visit | Attending: Obstetrics and Gynecology | Admitting: Obstetrics and Gynecology

## 2021-04-15 DIAGNOSIS — Z30431 Encounter for routine checking of intrauterine contraceptive device: Secondary | ICD-10-CM

## 2021-04-15 MED ORDER — GADOBENATE DIMEGLUMINE 529 MG/ML IV SOLN
19.0000 mL | Freq: Once | INTRAVENOUS | Status: AC | PRN
  Administered 2021-04-15: 19 mL via INTRAVENOUS

## 2021-04-17 ENCOUNTER — Other Ambulatory Visit: Payer: 59

## 2021-05-07 ENCOUNTER — Ambulatory Visit
Admission: RE | Admit: 2021-05-07 | Discharge: 2021-05-07 | Disposition: A | Discharge status: Home / Self Care | Payer: 59 | Source: Ambulatory Visit | Attending: Obstetrics and Gynecology | Admitting: Obstetrics and Gynecology

## 2021-05-07 ENCOUNTER — Other Ambulatory Visit: Payer: Self-pay

## 2021-05-07 ENCOUNTER — Other Ambulatory Visit: Payer: Self-pay | Admitting: Obstetrics and Gynecology

## 2021-05-07 DIAGNOSIS — N63 Unspecified lump in unspecified breast: Secondary | ICD-10-CM

## 2021-05-19 ENCOUNTER — Other Ambulatory Visit: Payer: Self-pay | Admitting: Obstetrics and Gynecology

## 2021-05-19 ENCOUNTER — Ambulatory Visit
Admission: RE | Admit: 2021-05-19 | Discharge: 2021-05-19 | Disposition: A | Discharge status: Home / Self Care | Payer: 59 | Source: Ambulatory Visit | Attending: Obstetrics and Gynecology | Admitting: Obstetrics and Gynecology

## 2021-05-19 DIAGNOSIS — N63 Unspecified lump in unspecified breast: Secondary | ICD-10-CM

## 2021-09-10 ENCOUNTER — Other Ambulatory Visit: Payer: Self-pay | Admitting: Internal Medicine

## 2021-09-10 ENCOUNTER — Ambulatory Visit
Admission: RE | Admit: 2021-09-10 | Discharge: 2021-09-10 | Disposition: A | Discharge status: Home / Self Care | Payer: 59 | Source: Ambulatory Visit | Attending: Internal Medicine | Admitting: Internal Medicine

## 2021-09-10 ENCOUNTER — Other Ambulatory Visit: Payer: Self-pay

## 2021-09-10 DIAGNOSIS — M25511 Pain in right shoulder: Secondary | ICD-10-CM

## 2022-02-16 ENCOUNTER — Encounter (HOSPITAL_BASED_OUTPATIENT_CLINIC_OR_DEPARTMENT_OTHER): Payer: Self-pay | Admitting: Obstetrics and Gynecology

## 2022-02-17 ENCOUNTER — Encounter (HOSPITAL_BASED_OUTPATIENT_CLINIC_OR_DEPARTMENT_OTHER): Payer: Self-pay | Admitting: Obstetrics and Gynecology

## 2022-02-17 ENCOUNTER — Other Ambulatory Visit: Payer: Self-pay

## 2022-02-17 DIAGNOSIS — M1990 Unspecified osteoarthritis, unspecified site: Secondary | ICD-10-CM

## 2022-02-17 HISTORY — DX: Unspecified osteoarthritis, unspecified site: M19.90

## 2022-02-17 NOTE — Progress Notes (Addendum)
Spoke w/ via phone for pre-op interview---[facility] Lab needs dos----urine pregnancy POCT               Lab results------02/21/22 lab appt for CBC, BMP, type & screen, EKG, 03/31/21 MRI abdomen COVID test -----patient states asymptomatic no test needed Arrive at -------0530 on Wednesday, 02/23/22 NPO after MN NO Solid Food.  Clear liquids from MN until---0430 Med rec completed Medications to take morning of surgery -----Norvasc Diabetic medication -----Do not take Mounjaro the morning of surgery. Patient instructed no nail polish to be worn day of surgery Patient instructed to bring photo id and insurance card day of surgery Patient aware to have Driver (ride ) / caregiver    for 24 hours after surgery  Patient Special Instructions -----Extended / Overnight instructions given.  Pre-Op special Istructions -----Patient stated that she would wear glasses instead of contact lenses on the day of surgery. Patient verbalized understanding of instructions that were given at this phone interview. Patient denies shortness of breath, chest pain, fever, cough at this phone interview.

## 2022-02-17 NOTE — Progress Notes (Addendum)
Your procedure is scheduled on Wednesday, 02/23/22.  Report to [facility].   Call this number if you have problems the morning of surgery  :[phone].   OUR ADDRESS IS [address].  WE ARE LOCATED IN [facility].  PLEASE BRING YOUR INSURANCE CARD AND PHOTO ID DAY OF SURGERY.  ONLY 2 PEOPLE ARE ALLOWED IN  WAITING  ROOM.                                      REMEMBER:  DO NOT EAT FOOD, CANDY GUM OR MINTS  AFTER MIDNIGHT THE NIGHT BEFORE YOUR SURGERY . YOU MAY HAVE CLEAR LIQUIDS FROM MIDNIGHT THE NIGHT BEFORE YOUR SURGERY UNTIL  4:30 AM. NO CLEAR LIQUIDS AFTER   4:30 AM DAY OF SURGERY.  YOU MAY  BRUSH YOUR TEETH MORNING OF SURGERY AND RINSE YOUR MOUTH OUT, NO CHEWING GUM CANDY OR MINTS.     CLEAR LIQUID DIET   Foods Allowed                                                                     Foods Excluded  Coffee and tea, regular and decaf                             liquids that you cannot  Plain Jell-O                                                                   see through such as: Fruit ices (not with fruit pulp)                                     milk, soups, orange juice  Plain  Popsicles                                    All solid food Carbonated beverages, regular and diet                                    Cranberry, grape and apple juices Sports drinks like Gatorade _____________________________________________________________________     TAKE THESE MEDICATIONS MORNING OF SURGERY: Norvasc (amlodipine)  Do not take any other medications or vitamins on the morning of surgery.    UP TO 4 VISITORS  MAY VISIT IN THE EXTENDED RECOVERY ROOM UNTIL 800 PM ONLY.  1 VISITOR AGE 67 AND OVER MAY SPEND THE NIGHT AND MUST BE IN EXTENDED RECOVERY ROOM NO LATER THAN 800 PM . YOUR DISCHARGE TIME AFTER YOU SPEND THE NIGHT IS 900 AM THE MORNING AFTER YOUR SURGERY.  YOU MAY PACK A SMALL OVERNIGHT BAG WITH TOILETRIES  FOR YOUR OVERNIGHT STAY IF YOU WISH.  YOUR PRESCRIPTION MEDICATIONS WILL BE PROVIDED DURING [facility].                                      DO NOT WEAR JEWERLY, MAKE UP. DO NOT WEAR LOTIONS, POWDERS, PERFUMES OR NAIL POLISH ON YOUR FINGERNAILS. TOENAIL POLISH IS OK TO WEAR. DO NOT SHAVE FOR 48 HOURS PRIOR TO DAY OF SURGERY. MEN MAY SHAVE FACE AND NECK. CONTACTS, GLASSES, OR DENTURES MAY NOT BE WORN TO SURGERY.  REMEMBER: NO SMOKING, DRUGS OR ALCOHOL FOR 24 HOURS BEFORE YOUR SURGERY.                                    [facility] IS NOT RESPONSIBLE  FOR ANY BELONGINGS.                                                                    Marland Kitchen            - Preparing for Surgery Before surgery, you can play an important role.  Because skin is not sterile, your skin needs to be as free of germs as possible.  You can reduce the number of germs on your skin by washing with CHG (chlorahexidine gluconate) soap before surgery.  CHG is an antiseptic cleaner which kills germs and bonds with the skin to continue killing germs even after washing. Please DO NOT use if you have an allergy to CHG or antibacterial soaps.  If your skin becomes reddened/irritated stop using the CHG and inform your nurse when you arrive at Short Stay. Do not shave (including legs and underarms) for at least 48 hours prior to the first CHG shower.  You may shave your face/neck. Please follow these instructions carefully:  1.  Shower with CHG Soap the night before surgery and the  morning of Surgery.  2.  If you choose to wash your hair, wash your hair first as usual with your  normal  shampoo.  3.  After you shampoo, rinse your hair and body thoroughly to remove the  shampoo.                            4.  Use CHG as you would any other liquid soap.  You can apply chg directly  to the skin and wash , please wash your belly button thoroughly with chg soap provided night before and morning of your surgery.                      Gently with a scrungie or clean washcloth.  5.  Apply the CHG Soap to your body ONLY FROM THE NECK DOWN.   Do not use on face/ open                           Wound or open sores. Avoid contact with eyes, ears mouth and genitals (private  parts).                       Wash face,  Genitals (private parts) with your normal soap.             6.  Wash thoroughly, paying special attention to the area where your surgery  will be performed.  7.  Thoroughly rinse your body with warm water from the neck down.  8.  DO NOT shower/wash with your normal soap after using and rinsing off  the CHG Soap.                9.  Pat yourself dry with a clean towel.            10.  Wear clean pajamas.            11.  Place clean sheets on your bed the night of your first shower and do not  sleep with pets. Day of Surgery : Do not apply any lotions/deodorants the morning of surgery.  Please wear clean clothes to the hospital/surgery center.  IF YOU HAVE ANY SKIN IRRITATION OR PROBLEMS WITH THE SURGICAL SOAP, PLEASE GET A BAR OF GOLD DIAL SOAP AND SHOWER THE NIGHT BEFORE YOUR SURGERY AND THE MORNING OF YOUR SURGERY. PLEASE LET THE NURSE KNOW MORNING OF YOUR SURGERY IF YOU HAD ANY PROBLEMS WITH THE SURGICAL SOAP.   ________________________________________________________________________                                                        QUESTIONS [facility] PRE OP NURSE PHONE [phone].

## 2022-02-21 ENCOUNTER — Encounter (HOSPITAL_COMMUNITY)
Admission: RE | Admit: 2022-02-21 | Discharge: 2022-02-21 | Disposition: A | Discharge status: Home / Self Care | Payer: 59 | Source: Ambulatory Visit | Attending: Obstetrics and Gynecology | Admitting: Obstetrics and Gynecology

## 2022-02-21 DIAGNOSIS — N939 Abnormal uterine and vaginal bleeding, unspecified: Secondary | ICD-10-CM | POA: Diagnosis not present

## 2022-02-21 DIAGNOSIS — Z01818 Encounter for other preprocedural examination: Secondary | ICD-10-CM | POA: Insufficient documentation

## 2022-02-21 LAB — CBC
HCT: 35 % — ABNORMAL LOW (ref 36.0–46.0)
Hemoglobin: 11.9 g/dL — ABNORMAL LOW (ref 12.0–15.0)
MCH: 29.2 pg (ref 26.0–34.0)
MCHC: 34 g/dL (ref 30.0–36.0)
MCV: 85.8 fL (ref 80.0–100.0)
Platelets: 318 10*3/uL (ref 150–400)
RBC: 4.08 MIL/uL (ref 3.87–5.11)
RDW: 13.5 % (ref 11.5–15.5)
WBC: 6.2 10*3/uL (ref 4.0–10.5)
nRBC: 0 % (ref 0.0–0.2)

## 2022-02-21 LAB — BASIC METABOLIC PANEL
Anion gap: 8 (ref 5–15)
BUN: 18 mg/dL (ref 6–20)
CO2: 27 mmol/L (ref 22–32)
Calcium: 8.7 mg/dL — ABNORMAL LOW (ref 8.9–10.3)
Chloride: 103 mmol/L (ref 98–111)
Creatinine, Ser: 0.86 mg/dL (ref 0.44–1.00)
GFR, Estimated: >60 mL/min (ref >60)
Glucose, Bld: 98 mg/dL (ref 70–99)
Potassium: 2.5 mmol/L — CL (ref 3.5–5.1)
Sodium: 138 mmol/L (ref 135–145)

## 2022-02-21 NOTE — Progress Notes (Signed)
CRITICAL RESULT PROVIDER NOTIFICATION  Test performed and critical result:  BMP, potassium 2.5  Date and time result received:  02-21-2022 @ 1028  Provider name/title: Dr Willis Modena  Date and time provider notified: 02-21-2022 @ 1029  Date and time provider responded: 02-21-2022 @ 1038  Provider response:No new orders No new orders at this time per Dr Willis Modena.  Stated he will speak with pt and have this taken care of prior to surgery.

## 2022-02-22 NOTE — Anesthesia Preprocedure Evaluation (Signed)
Anesthesia Evaluation  Patient identified by MRN, date of birth, ID band Patient awake    Reviewed: Allergy & Precautions, NPO status , Patient's Chart, lab work & pertinent test results, reviewed documented beta blocker date and time   History of Anesthesia Complications Negative for: history of anesthetic complications  Airway Mallampati: II  TM Distance: >3 FB Neck ROM: Full    Dental no notable dental hx.    Pulmonary former smoker,    Pulmonary exam normal        Cardiovascular hypertension, Pt. on medications and Pt. on home beta blockers Normal cardiovascular exam     Neuro/Psych negative neurological ROS  negative psych ROS   GI/Hepatic negative GI ROS, Neg liver ROS,   Endo/Other  K 3.0  Renal/GU negative Renal ROS  negative genitourinary   Musculoskeletal  (+) Arthritis ,   Abdominal   Peds  Hematology negative hematology ROS (+)   Anesthesia Other Findings Day of surgery medications reviewed with patient.  Reproductive/Obstetrics AUB                           Anesthesia Physical Anesthesia Plan  ASA: 2  Anesthesia Plan: General   Post-op Pain Management: Tylenol PO (pre-op)* and Toradol IV (intra-op)*   Induction: Intravenous  PONV Risk Score and Plan: 4 or greater and Treatment may vary due to age or medical condition, Ondansetron, Dexamethasone, Midazolam and Scopolamine patch - Pre-op  Airway Management Planned: LMA  Additional Equipment: None  Intra-op Plan:   Post-operative Plan: Extubation in OR  Informed Consent: I have reviewed the patients History and Physical, chart, labs and discussed the procedure including the risks, benefits and alternatives for the proposed anesthesia with the patient or authorized representative who has indicated his/her understanding and acceptance.     Dental advisory given  Plan Discussed with: CRNA  Anesthesia Plan  Comments:        Anesthesia Quick Evaluation

## 2022-02-22 NOTE — H&P (Signed)
Shannon Salas is an 46 y.o. female. She was seen for annual exam last June, was having light, irregular bleeding with Mirena.  Pelvic ultrasound in June with 3 small myomas, possible adenomyosis, IUD in proper position with one arm possibly embedded in myometrium.  MRI of pelvis in July did not demonstrate adenomyosis, but confirmed small myomas, IUD in proper position.  She continued to have irregular bleeding, despite a course of doxycycline, and wants to proceed with definitive surgical treatment.  She has h/o liver adenomas most likely induced by OCP use.  Pertinent Gynecological History: Last mammogram: abnormal: left breast mass benign by biopsy  Date: 05/2021 Last pap: normal Date: 03/2020 OB History: G3, P2012 LTCS, VBAC   Menstrual History: Patient's last menstrual period was 01/26/2022 (exact date).    Past Medical History:  Diagnosis Date   Arthritis 02/17/2022   both knees and right shoulder   Hepatic adenomatosis 2018   elevated alkaline phosphatase, [place] & MRI revealed multiple liver lesions, Patient follows with [facility]. LOV 03/31/21 in [place].   Hypertension    As of 02/17/22 patient is taking Metoprolol and Norvasc.   Overactive bladder    Pre-diabetes    hgb A1C 5.7 in 11/2021 per patient / Patient is taking Mounjaro as of 02/17/22.   Wears contact lenses    Wears glasses     Past Surgical History:  Procedure Laterality Date   APPENDECTOMY  1996   BREAST BIOPSY Left 2020   [place] AND CURETTAGE OF UTERUS  1997   LIVER BIOPSY  2018   x 2, adenomas   LIVER RESECTION  05/02/2018   laparascopic left lateral segmentectomy    Family History  Problem Relation Age of Onset   Breast cancer Maternal Grandmother        diagnosed mid 57's    Social History:  reports that she quit smoking about 15 years ago. Her smoking use included cigarettes. She has a 5.00 pack-year smoking history. She has never used smokeless  tobacco. She reports current alcohol use of about 2.0 standard drinks per week. She reports current drug use. Drug: Marijuana.  Allergies:  Allergies  Allergen Reactions   Other Hives    Any type of [place]   Penicillins Rash    No medications prior to admission.    Review of Systems  Respiratory: Negative.    Cardiovascular: Negative.    Height '5\' 3"'$  (1.6 m), weight 88.5 kg, last menstrual period 01/26/2022. Physical Exam Constitutional:      Appearance: Normal appearance.  Cardiovascular:     Rate and Rhythm: Normal rate and regular rhythm.     Heart sounds: Normal heart sounds. No murmur heard. Pulmonary:     Effort: Pulmonary effort is normal. No respiratory distress.     Breath sounds: Normal breath sounds.  Abdominal:     General: There is no distension.     Palpations: Abdomen is soft. There is no mass.     Tenderness: There is no abdominal tenderness.  Genitourinary:    General: Normal vulva.     Comments: Uterus normal size No adnexal mass IUD strings at 2-3 cm Musculoskeletal:     Cervical back: Normal range of motion and neck supple.  Neurological:     Mental Status: She is alert.    No results found for this or any previous visit (from the past 24 hour(s)).  No results found.  Assessment/Plan: Persistent abnormal uterine  bleeding with small myomas, h/o liver adenomas most likely from OCP use.  All medical and surgical options discussed, she wants definitive surgical treatment.  Surgical procedure, risks, alternatives, chances of relieving her bleeding have been discussed, questions answered.  Will admit for [facility], possible bilateral salpingectomy, cystoscopy  Blane Ohara Makela Niehoff 02/22/2022, 6:36 PM

## 2022-02-23 ENCOUNTER — Encounter (HOSPITAL_BASED_OUTPATIENT_CLINIC_OR_DEPARTMENT_OTHER): Payer: Self-pay | Admitting: Obstetrics and Gynecology

## 2022-02-23 ENCOUNTER — Encounter (HOSPITAL_BASED_OUTPATIENT_CLINIC_OR_DEPARTMENT_OTHER)
Admission: RE | Disposition: A | Discharge status: Home / Self Care | Payer: Self-pay | Source: Home / Self Care | Attending: Obstetrics and Gynecology

## 2022-02-23 ENCOUNTER — Other Ambulatory Visit: Payer: Self-pay

## 2022-02-23 ENCOUNTER — Ambulatory Visit (HOSPITAL_BASED_OUTPATIENT_CLINIC_OR_DEPARTMENT_OTHER): Payer: 59 | Admitting: Anesthesiology

## 2022-02-23 ENCOUNTER — Ambulatory Visit (HOSPITAL_BASED_OUTPATIENT_CLINIC_OR_DEPARTMENT_OTHER)
Admission: RE | Admit: 2022-02-23 | Discharge: 2022-02-23 | Disposition: A | Discharge status: Home / Self Care | Payer: 59 | Attending: Obstetrics and Gynecology | Admitting: Obstetrics and Gynecology

## 2022-02-23 DIAGNOSIS — Z90710 Acquired absence of both cervix and uterus: Secondary | ICD-10-CM | POA: Diagnosis present

## 2022-02-23 DIAGNOSIS — Z975 Presence of (intrauterine) contraceptive device: Secondary | ICD-10-CM | POA: Diagnosis not present

## 2022-02-23 DIAGNOSIS — Z01818 Encounter for other preprocedural examination: Secondary | ICD-10-CM

## 2022-02-23 DIAGNOSIS — N8003 Adenomyosis of the uterus: Secondary | ICD-10-CM | POA: Insufficient documentation

## 2022-02-23 DIAGNOSIS — I10 Essential (primary) hypertension: Secondary | ICD-10-CM | POA: Diagnosis not present

## 2022-02-23 DIAGNOSIS — N939 Abnormal uterine and vaginal bleeding, unspecified: Secondary | ICD-10-CM | POA: Diagnosis not present

## 2022-02-23 DIAGNOSIS — D259 Leiomyoma of uterus, unspecified: Secondary | ICD-10-CM | POA: Insufficient documentation

## 2022-02-23 DIAGNOSIS — N888 Other specified noninflammatory disorders of cervix uteri: Secondary | ICD-10-CM | POA: Diagnosis not present

## 2022-02-23 DIAGNOSIS — Z87891 Personal history of nicotine dependence: Secondary | ICD-10-CM | POA: Diagnosis not present

## 2022-02-23 HISTORY — DX: Prediabetes: R73.03

## 2022-02-23 HISTORY — DX: Presence of spectacles and contact lenses: Z97.3

## 2022-02-23 HISTORY — PX: CYSTOSCOPY: SHX5120

## 2022-02-23 HISTORY — PX: VAGINAL HYSTERECTOMY: SHX2639

## 2022-02-23 LAB — TYPE AND SCREEN
ABO/RH(D): O POS
Antibody Screen: NEGATIVE

## 2022-02-23 LAB — POCT PREGNANCY, URINE: Preg Test, Ur: NEGATIVE

## 2022-02-23 LAB — POCT I-STAT, CHEM 8
BUN: 15 mg/dL (ref 6–20)
Calcium, Ion: 1.15 mmol/L (ref 1.15–1.40)
Chloride: 102 mmol/L (ref 98–111)
Creatinine, Ser: 0.8 mg/dL (ref 0.44–1.00)
Glucose, Bld: 93 mg/dL (ref 70–99)
HCT: 37 % (ref 36.0–46.0)
Hemoglobin: 12.6 g/dL (ref 12.0–15.0)
Potassium: 3 mmol/L — ABNORMAL LOW (ref 3.5–5.1)
Sodium: 142 mmol/L (ref 135–145)
TCO2: 28 mmol/L (ref 22–32)

## 2022-02-23 LAB — ABO/RH: ABO/RH(D): O POS

## 2022-02-23 SURGERY — HYSTERECTOMY, VAGINAL
Anesthesia: General | Site: Vagina

## 2022-02-23 MED ORDER — ONDANSETRON HCL 4 MG/2ML IJ SOLN
INTRAMUSCULAR | Status: AC
  Filled 2022-02-23: qty 2

## 2022-02-23 MED ORDER — LIDOCAINE 2% (20 MG/ML) 5 ML SYRINGE
INTRAMUSCULAR | Status: DC | PRN
  Administered 2022-02-23: 100 mg via INTRAVENOUS

## 2022-02-23 MED ORDER — METOPROLOL SUCCINATE ER 50 MG PO TB24
50.0000 mg | ORAL_TABLET | Freq: Every evening | ORAL | Status: DC
  Filled 2022-02-23: qty 1

## 2022-02-23 MED ORDER — PROPOFOL 10 MG/ML IV BOLUS
INTRAVENOUS | Status: AC
  Filled 2022-02-23: qty 20

## 2022-02-23 MED ORDER — KETOROLAC TROMETHAMINE 30 MG/ML IJ SOLN
INTRAMUSCULAR | Status: AC
  Filled 2022-02-23: qty 1

## 2022-02-23 MED ORDER — SCOPOLAMINE 1 MG/3DAYS TD PT72
MEDICATED_PATCH | TRANSDERMAL | Status: AC
  Filled 2022-02-23: qty 1

## 2022-02-23 MED ORDER — SCOPOLAMINE 1 MG/3DAYS TD PT72
1.0000 | MEDICATED_PATCH | TRANSDERMAL | Status: DC
  Administered 2022-02-23: 1.5 mg via TRANSDERMAL

## 2022-02-23 MED ORDER — ONDANSETRON HCL 4 MG/2ML IJ SOLN: 4.0000 mg | Freq: Four times a day (QID) | INTRAMUSCULAR | Status: DC | PRN

## 2022-02-23 MED ORDER — KETOROLAC TROMETHAMINE 30 MG/ML IJ SOLN
INTRAMUSCULAR | Status: DC | PRN
  Administered 2022-02-23: 30 mg via INTRAVENOUS

## 2022-02-23 MED ORDER — ONDANSETRON HCL 4 MG PO TABS: 4.0000 mg | ORAL_TABLET | Freq: Four times a day (QID) | ORAL | Status: DC | PRN

## 2022-02-23 MED ORDER — MIDAZOLAM HCL 2 MG/2ML IJ SOLN
INTRAMUSCULAR | Status: AC
  Filled 2022-02-23: qty 2

## 2022-02-23 MED ORDER — ACETAMINOPHEN 500 MG PO TABS
ORAL_TABLET | ORAL | Status: AC
  Filled 2022-02-23: qty 2

## 2022-02-23 MED ORDER — OXYCODONE HCL 5 MG PO TABS
5.0000 mg | ORAL_TABLET | ORAL | Status: DC | PRN
  Administered 2022-02-23: 10 mg via ORAL

## 2022-02-23 MED ORDER — VASOPRESSIN 20 UNIT/ML IV SOLN
INTRAVENOUS | Status: DC | PRN
  Administered 2022-02-23: 51 mL via INTRAMUSCULAR

## 2022-02-23 MED ORDER — HYDROMORPHONE HCL 1 MG/ML IJ SOLN
0.2500 mg | INTRAMUSCULAR | Status: DC | PRN
  Administered 2022-02-23 (×3): 0.5 mg via INTRAVENOUS

## 2022-02-23 MED ORDER — POTASSIUM CHLORIDE CRYS ER 20 MEQ PO TBCR
20.0000 meq | EXTENDED_RELEASE_TABLET | Freq: Two times a day (BID) | ORAL | Status: DC
  Administered 2022-02-23: 20 meq via ORAL
  Filled 2022-02-23: qty 1

## 2022-02-23 MED ORDER — GABAPENTIN 300 MG PO CAPS: 300.0000 mg | ORAL_CAPSULE | Freq: Three times a day (TID) | ORAL | 0 refills | Status: AC

## 2022-02-23 MED ORDER — ACETAMINOPHEN 500 MG PO TABS
1000.0000 mg | ORAL_TABLET | ORAL | Status: AC
  Administered 2022-02-23: 1000 mg via ORAL

## 2022-02-23 MED ORDER — FENTANYL CITRATE (PF) 250 MCG/5ML IJ SOLN
INTRAMUSCULAR | Status: AC
  Filled 2022-02-23: qty 5

## 2022-02-23 MED ORDER — DEXAMETHASONE SODIUM PHOSPHATE 10 MG/ML IJ SOLN
INTRAMUSCULAR | Status: DC | PRN
  Administered 2022-02-23: 4 mg via INTRAVENOUS

## 2022-02-23 MED ORDER — ALUM & MAG HYDROXIDE-SIMETH 200-200-20 MG/5ML PO SUSP: 30.0000 mL | ORAL | Status: DC | PRN

## 2022-02-23 MED ORDER — ONDANSETRON HCL 4 MG/2ML IJ SOLN
INTRAMUSCULAR | Status: DC | PRN
  Administered 2022-02-23: 4 mg via INTRAVENOUS

## 2022-02-23 MED ORDER — 0.9 % SODIUM CHLORIDE (POUR BTL) OPTIME
TOPICAL | Status: DC | PRN
  Administered 2022-02-23: 500 mL

## 2022-02-23 MED ORDER — HYDROMORPHONE HCL 1 MG/ML IJ SOLN
INTRAMUSCULAR | Status: AC
  Filled 2022-02-23: qty 1

## 2022-02-23 MED ORDER — GABAPENTIN 300 MG PO CAPS
ORAL_CAPSULE | ORAL | Status: AC
  Filled 2022-02-23: qty 1

## 2022-02-23 MED ORDER — OXYCODONE HCL 5 MG PO TABS
ORAL_TABLET | ORAL | Status: AC
  Filled 2022-02-23: qty 2

## 2022-02-23 MED ORDER — GABAPENTIN 300 MG PO CAPS
300.0000 mg | ORAL_CAPSULE | ORAL | Status: AC
  Administered 2022-02-23: 300 mg via ORAL

## 2022-02-23 MED ORDER — CEFAZOLIN SODIUM-DEXTROSE 2-4 GM/100ML-% IV SOLN: 2.0000 g | INTRAVENOUS | Status: DC

## 2022-02-23 MED ORDER — DEXTROSE-NACL 5-0.45 % IV SOLN: INTRAVENOUS | Status: DC

## 2022-02-23 MED ORDER — LACTATED RINGERS IV SOLN: INTRAVENOUS | Status: DC

## 2022-02-23 MED ORDER — HYDROMORPHONE HCL 1 MG/ML IJ SOLN: 1.0000 mg | INTRAMUSCULAR | Status: DC | PRN

## 2022-02-23 MED ORDER — KETOROLAC TROMETHAMINE 30 MG/ML IJ SOLN
30.0000 mg | Freq: Four times a day (QID) | INTRAMUSCULAR | Status: DC
  Administered 2022-02-23: 30 mg via INTRAVENOUS

## 2022-02-23 MED ORDER — SODIUM CHLORIDE 0.9 % IR SOLN
Status: DC | PRN
  Administered 2022-02-23: 1000 mL via INTRAVESICAL

## 2022-02-23 MED ORDER — FENTANYL CITRATE (PF) 100 MCG/2ML IJ SOLN
INTRAMUSCULAR | Status: DC | PRN
Start: 2022-02-23 — End: 2022-02-23
  Administered 2022-02-23 (×2): 25 ug via INTRAVENOUS
  Administered 2022-02-23: 100 ug via INTRAVENOUS

## 2022-02-23 MED ORDER — OXYCODONE HCL 5 MG PO TABS: 5.0000 mg | ORAL_TABLET | ORAL | 0 refills | Status: AC | PRN

## 2022-02-23 MED ORDER — PROPOFOL 10 MG/ML IV BOLUS
INTRAVENOUS | Status: DC | PRN
  Administered 2022-02-23: 180 mg via INTRAVENOUS

## 2022-02-23 MED ORDER — SIMETHICONE 80 MG PO CHEW: 80.0000 mg | CHEWABLE_TABLET | Freq: Four times a day (QID) | ORAL | Status: DC | PRN

## 2022-02-23 MED ORDER — IBUPROFEN 200 MG PO TABS: 600.0000 mg | ORAL_TABLET | Freq: Four times a day (QID) | ORAL | Status: DC

## 2022-02-23 MED ORDER — DEXAMETHASONE SODIUM PHOSPHATE 10 MG/ML IJ SOLN
INTRAMUSCULAR | Status: AC
  Filled 2022-02-23: qty 1

## 2022-02-23 MED ORDER — GABAPENTIN 300 MG PO CAPS
300.0000 mg | ORAL_CAPSULE | Freq: Three times a day (TID) | ORAL | Status: DC
  Administered 2022-02-23: 300 mg via ORAL

## 2022-02-23 MED ORDER — BISACODYL 10 MG RE SUPP: 10.0000 mg | Freq: Every day | RECTAL | Status: DC | PRN

## 2022-02-23 MED ORDER — MENTHOL 3 MG MT LOZG: 1.0000 | LOZENGE | OROMUCOSAL | Status: DC | PRN

## 2022-02-23 MED ORDER — MIDAZOLAM HCL 2 MG/2ML IJ SOLN
INTRAMUSCULAR | Status: DC | PRN
  Administered 2022-02-23: 2 mg via INTRAVENOUS

## 2022-02-23 MED ORDER — DROPERIDOL 2.5 MG/ML IJ SOLN: 0.6250 mg | Freq: Once | INTRAMUSCULAR | Status: DC | PRN

## 2022-02-23 MED ORDER — BUPIVACAINE HCL (PF) 0.5 % IJ SOLN
INTRAMUSCULAR | Status: DC | PRN
  Administered 2022-02-23: 30 mL

## 2022-02-23 MED ORDER — ACETAMINOPHEN 500 MG PO TABS
1000.0000 mg | ORAL_TABLET | Freq: Four times a day (QID) | ORAL | Status: DC
  Administered 2022-02-23: 1000 mg via ORAL

## 2022-02-23 MED ORDER — CEFAZOLIN SODIUM-DEXTROSE 2-4 GM/100ML-% IV SOLN
INTRAVENOUS | Status: AC
  Filled 2022-02-23: qty 100

## 2022-02-23 SURGICAL SUPPLY — 27 items
BLADE SURG 15 STRL LF DISP TIS (BLADE) IMPLANT
BLADE SURG 15 STRL SS (BLADE)
CATH ROBINSON RED A/P 16FR (CATHETERS) ×4 IMPLANT
CNTNR URN SCR LID CUP LEK RST (MISCELLANEOUS) IMPLANT
CONT SPEC 4OZ STRL OR WHT (MISCELLANEOUS)
DECANTER SPIKE VIAL GLASS SM (MISCELLANEOUS) IMPLANT
GAUZE 4X4 16PLY ~~LOC~~+RFID DBL (SPONGE) ×8 IMPLANT
GLOVE BIOGEL PI IND STRL 8 (GLOVE) ×2 IMPLANT
GLOVE BIOGEL PI INDICATOR 8 (GLOVE) ×1
GLOVE ORTHO TXT STRL SZ7.5 (GLOVE) ×3 IMPLANT
GOWN STRL REUS W/TWL XL LVL3 (GOWN DISPOSABLE) ×3 IMPLANT
KIT TURNOVER CYSTO (KITS) ×3 IMPLANT
NS IRRIG 500ML POUR BTL (IV SOLUTION) ×3 IMPLANT
PACK VAGINAL WOMENS (CUSTOM PROCEDURE TRAY) ×3 IMPLANT
PAD OB MATERNITY 4.3X12.25 (PERSONAL CARE ITEMS) ×3 IMPLANT
SET IRRIG Y TYPE TUR BLADDER L (SET/KITS/TRAYS/PACK) IMPLANT
SHEARS FOC LG CVD HARMONIC 17C (MISCELLANEOUS) ×3 IMPLANT
SUT CHROMIC 1 TIES 18 (SUTURE) IMPLANT
SUT CHROMIC 1MO 4 18 CR8 (SUTURE) ×3 IMPLANT
SUT SILK 2 0 SH (SUTURE) ×3 IMPLANT
SUT VIC AB 2-0 CT1 (SUTURE) IMPLANT
SUT VIC AB 2-0 CT1 27 (SUTURE) ×3
SUT VIC AB 2-0 CT1 TAPERPNT 27 (SUTURE) ×2 IMPLANT
SUT VIC AB 3-0 SH 27 (SUTURE)
SUT VIC AB 3-0 SH 27X BRD (SUTURE) IMPLANT
TOWEL OR 17X26 10 PK STRL BLUE (TOWEL DISPOSABLE) ×3 IMPLANT
TRAY FOLEY W/BAG SLVR 14FR LF (SET/KITS/TRAYS/PACK) IMPLANT

## 2022-02-23 NOTE — Discharge Instructions (Addendum)
   Post Anesthesia Home Care Instructions  Activity: Get plenty of rest for the remainder of the day. A responsible individual must stay with you for 24 hours following the procedure.  For the next 24 hours, DO NOT: -Drive a car -Paediatric nurse -Drink alcoholic beverages -Take any medication unless instructed by your physician -Make any legal decisions or sign important papers.  Meals: Start with liquid foods such as gelatin or soup. Progress to regular foods as tolerated. Avoid greasy, spicy, heavy foods. If nausea and/or vomiting occur, drink only clear liquids until the nausea and/or vomiting subsides. Call your physician if vomiting continues.  Special Instructions/Symptoms: Your throat may feel dry or sore from the anesthesia or the breathing tube placed in your throat during surgery. If this causes discomfort, gargle with warm salt water. The discomfort should disappear within 24 hours.  If you had a scopolamine patch placed behind your ear for the management of post- operative nausea and/or vomiting:  1. The medication in the patch is effective for 72 hours, after which it should be removed.  Wrap patch in a tissue and discard in the trash. Wash hands thoroughly with soap and water. 2. You may remove the patch earlier than 72 hours if you experience unpleasant side effects which may include dry mouth, dizziness or visual disturbances. 3. Avoid touching the patch. Wash your hands with soap and water after contact with the patch. 4. Please remove by 02/26/2022    May take tylenol again at 6pm if needed May take ibuprofen at 8:15pm if needed

## 2022-02-23 NOTE — Transfer of Care (Signed)
Immediate Anesthesia Transfer of Care Note  Patient: Shannon Salas  Procedure(s) Performed: Procedure(s) (LRB): VAGINAL HYSTERECTOMY, BILATERAL SALPINGECTOMY (Bilateral) CYSTOSCOPY (N/A)  Patient Location: PACU  Anesthesia Type: General  Level of Consciousness: awake, alert  and oriented  Airway & Oxygen Therapy: Patient Spontanous Breathing and Patient connected to face mask oxygen  Post-op Assessment: Report given to PACU RN and Post -op Vital signs reviewed and stable  Post vital signs: Reviewed and stable  Complications: No apparent anesthesia complications  Last Vitals:  Vitals Value Taken Time  BP 138/86 02/23/22 0930  Temp 36.4 C 02/23/22 0848  Pulse 71 02/23/22 0930  Resp 8 02/23/22 0930  SpO2 98 % 02/23/22 0930  Vitals shown include unvalidated device data.  Last Pain:  Vitals:   02/23/22 0915  TempSrc:   PainSc: 9       Patients Stated Pain Goal: 3 (49/32/41 9914)  Complications: No notable events documented.

## 2022-02-23 NOTE — Anesthesia Procedure Notes (Signed)
Procedure Name: LMA Insertion Date/Time: 02/23/2022 7:34 AM Performed by: Mechele Claude, CRNA Pre-anesthesia Checklist: Patient identified, Emergency Drugs available, Suction available and Patient being monitored Patient Re-evaluated:Patient Re-evaluated prior to induction Oxygen Delivery Method: Circle system utilized Preoxygenation: Pre-oxygenation with 100% oxygen Induction Type: IV induction Ventilation: Mask ventilation without difficulty LMA: LMA inserted LMA Size: 4.0 Number of attempts: 1 Airway Equipment and Method: Bite block Placement Confirmation: positive ETCO2 Tube secured with: Tape Dental Injury: Teeth and Oropharynx as per pre-operative assessment

## 2022-02-23 NOTE — Op Note (Addendum)
Preoperative diagnosis: Abnormal uterine bleeding Postop diagnosis: Same Procedure: Total vaginal hysterectomy, bilateral salpingectomy and cystoscopy Surgeon: Cheri Fowler M.D. Assistant: Paula Compton M.D. Anesthesia: Gen. Findings: Patient had a normal uterus with normal tubes and ovaries. Via cystoscopy bladder was normal and both ureters were patent.  Assistance from Dr. Marvel Plan was necessary throughout the case to help expose pedicles and control bleeding Estimated blood loss: 100 cc Specimens: Uterus and tubes for routine pathology Complications: None   Procedure in detail: The patient was taken to the operating room and placed in the dorsosupine position. General anesthesia was induced and she was placed in mobile stirrups. Legs were elevated in the stirrups. Perineum and vagina were then prepped and draped in the usual sterile fashion, bladder drained with red Robinson catheter. A Graves speculum was inserted in the vagina and the cervix was grasped with Ardis Hughs tenaculum. Her Mirena IUD was removed intact without difficulty.  Dilute Pitressin with Marcaine was then instilled at the cervicovaginal junction which was then incised circumferentially with electrocautery. Sharp dissection was then used to further free the vagina from the cervix. Anterior peritoneum was identified and entered sharply. A Deaver retractor was used to retract the bladder anteriorly. Posterior cul-de-sac was identified and entered sharply. A Bonnano speculum was placed into the posterior cul-de-sac. Uterosacral ligaments were clamped transected and ligated with #1 chromic and tagged for later use. The remaining pedicles were then taken down with harmonic scalpel.  When just the tubes were still attached, the uterus was delivered posteriorly.  The tubes were still difficult to visualize, so harmonic scalpel was used to free the uterus and it was removed. I was then able to identify and bring each tube down,   Harmonic scalpel was the used to free the tubes and they were removed. Ovaries were inspected and found to be normal. A small amount of bleeding was controlled with the Harmonic scalpel.  The right uterosacral suture was inadvertently cut with the harmonic scalpel, so a second suture of #1 chromic was placed on this pedicle.  The uterosacral ligaments were then plicated in the midline with 2-0 silk after the Bonnano speculum was removed and a shorter vaginal speculum was placed. The previously tagged uterosacral pedicles were also tied in the midline. No significant bleeding was identified. The vagina was then closed in a vertical fashion with running locking 2-0 Vicryl with adequate closure and adequate hemostasis.   Attention was turned to cystoscopy.  A 70 cystoscope was inserted with some difficulty due to a stenotic urethra,  and 200 cc of fluid was instilled. The bladder appeared normal. Both ureteral orifices were easily identified and urine was seen to flow freely from each orifice. The cystoscope was removed after the bladder was drained. The patient was taken down from stirrups. She was awakened in the operating room and taken to the recovery room in stable condition after tolerating the procedure well. Counts were correct, she had PAS hose on throughout the procedure, she received Ancef preop.

## 2022-02-23 NOTE — Interval H&P Note (Signed)
History and Physical Interval Note:  02/23/2022 7:21 AM  Shannon Salas  has presented today for surgery, with the diagnosis of ABNORMAL BLEEDING.  The various methods of treatment have been discussed with the patient and family. After consideration of risks, benefits and other options for treatment, the patient has consented to  Procedure(s): VAGINAL HYSTERECTOMY, BILATERAL SALPINGECTOMY (Bilateral) CYSTOSCOPY (N/A) as a surgical intervention.  The patient's history has been reviewed, patient examined, no change in status, stable for surgery.  I have reviewed the patient's chart and labs.  Questions were answered to the patient's satisfaction.  Rechecking her K+ which was 2.5, she did increase her PO K+ for the past 2 days   Shannon Salas

## 2022-02-23 NOTE — Anesthesia Postprocedure Evaluation (Signed)
Anesthesia Post Note  Patient: Shannon Salas  Procedure(s) Performed: VAGINAL HYSTERECTOMY, BILATERAL SALPINGECTOMY (Bilateral: Vagina ) CYSTOSCOPY (Bladder)     Patient location during evaluation: PACU Anesthesia Type: General Level of consciousness: awake and alert Pain management: pain level controlled Vital Signs Assessment: post-procedure vital signs reviewed and stable Respiratory status: spontaneous breathing, nonlabored ventilation and respiratory function stable Cardiovascular status: blood pressure returned to baseline Postop Assessment: no apparent nausea or vomiting Anesthetic complications: no   No notable events documented.  Last Vitals:  Vitals:   02/23/22 0915 02/23/22 0930  BP: 125/86 138/86  Pulse: 80 73  Resp: 14 15  Temp:    SpO2: 98% 100%                  Marthenia Rolling

## 2022-02-23 NOTE — Progress Notes (Signed)
Post-op check, s/p TVH Doing well, mild pain. No n/v, ambulating, voiding, tol PO Afeb, VSS Abd- soft Reviewed procedure and picture, she wants to go home

## 2022-02-24 ENCOUNTER — Encounter (HOSPITAL_BASED_OUTPATIENT_CLINIC_OR_DEPARTMENT_OTHER): Payer: Self-pay | Admitting: Obstetrics and Gynecology

## 2022-02-24 LAB — SURGICAL PATHOLOGY

## 2022-05-09 ENCOUNTER — Ambulatory Visit
Admission: RE | Admit: 2022-05-09 | Discharge: 2022-05-09 | Disposition: A | Discharge status: Home / Self Care | Payer: 59 | Source: Ambulatory Visit

## 2022-05-09 DIAGNOSIS — Z1231 Encounter for screening mammogram for malignant neoplasm of breast: Secondary | ICD-10-CM

## 2022-05-11 ENCOUNTER — Other Ambulatory Visit: Payer: Self-pay | Admitting: Obstetrics and Gynecology

## 2022-05-11 DIAGNOSIS — R928 Other abnormal and inconclusive findings on diagnostic imaging of breast: Secondary | ICD-10-CM

## 2022-05-20 ENCOUNTER — Ambulatory Visit
Admission: RE | Admit: 2022-05-20 | Discharge: 2022-05-20 | Disposition: A | Discharge status: Home / Self Care | Payer: 59 | Source: Ambulatory Visit | Attending: Obstetrics and Gynecology | Admitting: Obstetrics and Gynecology

## 2022-05-20 DIAGNOSIS — R928 Other abnormal and inconclusive findings on diagnostic imaging of breast: Secondary | ICD-10-CM

## 2023-10-20 ENCOUNTER — Encounter (HOSPITAL_BASED_OUTPATIENT_CLINIC_OR_DEPARTMENT_OTHER): Payer: Self-pay

## 2023-10-20 ENCOUNTER — Emergency Department (HOSPITAL_BASED_OUTPATIENT_CLINIC_OR_DEPARTMENT_OTHER)
Admission: EM | Admit: 2023-10-20 | Discharge: 2023-10-20 | Disposition: A | Discharge status: Home / Self Care | Payer: Commercial Managed Care - PPO | Attending: Emergency Medicine | Admitting: Emergency Medicine

## 2023-10-20 ENCOUNTER — Other Ambulatory Visit: Payer: Self-pay

## 2023-10-20 DIAGNOSIS — J029 Acute pharyngitis, unspecified: Secondary | ICD-10-CM | POA: Insufficient documentation

## 2023-10-20 DIAGNOSIS — Z1152 Encounter for screening for COVID-19: Secondary | ICD-10-CM | POA: Diagnosis not present

## 2023-10-20 LAB — RESP PANEL BY RT-PCR (RSV, FLU A&B, COVID)  RVPGX2
Influenza A by PCR: NEGATIVE
Influenza B by PCR: NEGATIVE
Resp Syncytial Virus by PCR: NEGATIVE
SARS Coronavirus 2 by RT PCR: NEGATIVE

## 2023-10-20 LAB — GROUP A STREP BY PCR: Group A Strep by PCR: NOT DETECTED

## 2023-10-20 MED ORDER — LIDOCAINE VISCOUS HCL 2 % MT SOLN: 15.0000 mL | OROMUCOSAL | 0 refills | Status: AC | PRN

## 2023-10-20 NOTE — ED Triage Notes (Signed)
Pt c/o sore throat that started yesterday. Pt reports her throat felt funny last Saturday, when she woke up Wednesday her throat was sore.

## 2023-10-20 NOTE — ED Provider Notes (Signed)
[place] EMERGENCY DEPARTMENT AT [facility] Provider Note   CSN: [id number] Arrival date & time: 10/20/23  1715     History  Chief Complaint  Patient presents with   Sore Throat    Shannon Salas is a 48 y.o. female,.  No pertinent past medical hx, who presents to the ED secondary to sore throat, that is been going on for the last 7 days.  She states it has gotten worse, over the last couple days, she went to get checked out.  She states she wanted to make sure she was doing okay, as she has a [place] at home. Denies any runny nose, cough, chest pain, or shortness of breath  Home Medications Prior to Admission medications   Medication Sig Start Date End Date Taking? Authorizing Provider  lidocaine (XYLOCAINE) 2 % solution Use as directed 15 mLs in the mouth or throat as needed for mouth pain. 10/20/23  Yes Tahliyah Anagnos L, PA  amLODipine (NORVASC) 5 MG tablet Take 5 mg by mouth 2 (two) times daily.    [provider]  APPLE CIDER VINEGAR PO Take by mouth.    [provider]  b complex vitamins tablet Take 1 tablet by mouth daily.    [provider]  Cholecalciferol (VITAMIN D3) 1.25 MG (50000 UT) CAPS Take by mouth.    [provider]  Cranberry 400 MG CAPS Take 1 capsule by mouth daily.    [provider]  docusate sodium (COLACE) 100 MG capsule Take 100 mg by mouth 2 (two) times daily.    [provider]  gabapentin (NEURONTIN) 300 MG capsule Take 1 capsule (300 mg total) by mouth 3 (three) times daily for 2 days. 02/23/22 02/25/22  Meisinger, Tawanna Cooler, MD  levonorgestrel (MIRENA) 20 MCG/DAY IUD 1 each by Intrauterine route once.    [provider]  metoprolol succinate (TOPROL-XL) 50 MG 24 hr tablet Take 50 mg by mouth every evening. Take with or immediately following a meal.    [provider]  Omega-3 Fatty Acids (FISH OIL) 1000 MG CAPS Take 1 capsule by mouth 2 (two) times daily.    [provider]  oxyCODONE (OXY IR/ROXICODONE) 5 MG immediate release tablet Take 1 tablet (5 mg total) by mouth every 4 (four) hours as needed for severe pain. 02/23/22   Meisinger, Todd, MD  potassium chloride SA (KLOR-CON M) 20 MEQ tablet Take 20 mEq by mouth 2 (two) times daily.    [provider]  tirzepatide Greggory Keen) 5 MG/0.5ML Pen Inject 5 mg into the skin once a week. Friday's    [provider]  valsartan-hydrochlorothiazide (DIOVAN-HCT) 320-25 MG tablet Take 1 tablet by mouth daily.    [provider]  zinc gluconate 50 MG tablet Take 50 mg by mouth daily.    [provider]      Allergies    Other and Penicillins    Review of Systems   Review of Systems  HENT:  Positive for sore throat. Negative for sinus pain.     Physical Exam Updated Vital Signs BP (!) 165/107 (BP Location: Right Arm)   Pulse (!) 101   Temp (!) 97.2 F (36.2 C)   Resp 18   Ht 5\' 3"  (1.6 m)   Wt 81.6 kg   SpO2 100%   BMI 31.89 kg/m  Physical Exam Vitals and nursing note reviewed.  Constitutional:      General: She is not in acute distress.  Appearance: She is well-developed.  HENT:     Head: Normocephalic and atraumatic.     Right Ear: Tympanic membrane normal.     Left Ear: Tympanic membrane normal.     Nose: No congestion.     Mouth/Throat:     Mouth: Mucous membranes are moist. No oral lesions.     Pharynx: Posterior oropharyngeal erythema present. No pharyngeal swelling, oropharyngeal exudate or uvula swelling.  Eyes:     Conjunctiva/sclera: Conjunctivae normal.  Cardiovascular:     Rate and Rhythm: Normal rate and regular rhythm.     Heart sounds: No murmur heard. Pulmonary:     Effort: Pulmonary effort is normal. No respiratory distress.     Breath sounds: Normal breath sounds.  Abdominal:     Palpations: Abdomen is soft.     Tenderness: There is no abdominal tenderness.  Musculoskeletal:        General: No swelling.     Cervical back: Neck  supple.  Skin:    General: Skin is warm and dry.     Capillary Refill: Capillary refill takes less than 2 seconds.  Neurological:     Mental Status: She is alert.  Psychiatric:        Mood and Affect: Mood normal.     ED Results / Procedures / Treatments   Labs (all labs ordered are listed, but only abnormal results are displayed) Labs Reviewed  GROUP A STREP BY PCR  RESP PANEL BY RT-PCR (RSV, FLU A&B, COVID)  [id number]    EKG None  Radiology No results found.  Procedures Procedures    Medications Ordered in ED Medications - No data to display  ED Course/ Medical Decision Making/ A&P                                 Medical Decision Making Patient is an overall well-appearing 48 year old, with sore throat, progressively getting worse.  On exam there is no evidence of a PTA, she does have erythema of the [place].  No evidence of any kind of tonsillitis, no exudates, inflamed or enlarged tonsils.  Overall uvula midline.  Will test for COVID/flu as well as strep, for further evaluation.  She has declined any medications while here in the ER  Amount and/or Complexity of Data Reviewed Labs:     Details: COVID/flu/strep negative Discussion of management or test interpretation with external provider(s): Patient's viral and strep swabs are negative, likely viral pharyngitis.  Will have her follow-up with her PCP as needed, and take lidocaine, Tylenol and ibuprofen for pain control.  Lidocaine sent to the pharmacy    Final Clinical Impression(s) / ED Diagnoses Final diagnoses:  Pharyngitis, unspecified etiology    Rx / DC Orders ED Discharge Orders          Ordered    lidocaine (XYLOCAINE) 2 % solution  As needed        10/20/23 1936              Jovie Swanner, Harley [place], PA 10/20/23 1937    Sloan Leiter, DO 10/21/23 1432

## 2023-10-20 NOTE — Discharge Instructions (Addendum)
Your strep and COVID and flu swabs are negative.  He likely does have viral pharyngitis.  Use the information above, to help with the pain.  You can do salt water gargles, use the lidocaine I sent you, or use cough drops at home, or ibuprofen or Tylenol for pain control.  Return to the ER if you have a muffled voice, or unable to speak in full sentences, due to shortness of breath, or feel like your symptoms are worsening
